# Patient Record
Sex: Female | Born: 1975 | Race: Black or African American | Hispanic: No | Marital: Married | Smoking: Former smoker
Health system: Southern US, Community
[De-identification: ages and names within clinical notes are randomized; demographics above are authoritative.]

## PROBLEM LIST (undated history)

## (undated) DIAGNOSIS — N926 Irregular menstruation, unspecified: Secondary | ICD-10-CM

## (undated) DIAGNOSIS — R0602 Shortness of breath: Secondary | ICD-10-CM

## (undated) DIAGNOSIS — Z8619 Personal history of other infectious and parasitic diseases: Secondary | ICD-10-CM

## (undated) DIAGNOSIS — Z8742 Personal history of other diseases of the female genital tract: Secondary | ICD-10-CM

## (undated) DIAGNOSIS — J069 Acute upper respiratory infection, unspecified: Secondary | ICD-10-CM

## (undated) DIAGNOSIS — B977 Papillomavirus as the cause of diseases classified elsewhere: Secondary | ICD-10-CM

## (undated) DIAGNOSIS — IMO0002 Reserved for concepts with insufficient information to code with codable children: Secondary | ICD-10-CM

## (undated) DIAGNOSIS — I739 Peripheral vascular disease, unspecified: Secondary | ICD-10-CM

## (undated) DIAGNOSIS — D219 Benign neoplasm of connective and other soft tissue, unspecified: Secondary | ICD-10-CM

## (undated) DIAGNOSIS — Z98891 History of uterine scar from previous surgery: Secondary | ICD-10-CM

## (undated) DIAGNOSIS — R51 Headache: Secondary | ICD-10-CM

## (undated) DIAGNOSIS — Z8719 Personal history of other diseases of the digestive system: Secondary | ICD-10-CM

## (undated) DIAGNOSIS — Z8739 Personal history of other diseases of the musculoskeletal system and connective tissue: Secondary | ICD-10-CM

## (undated) DIAGNOSIS — B999 Unspecified infectious disease: Secondary | ICD-10-CM

## (undated) DIAGNOSIS — E010 Iodine-deficiency related diffuse (endemic) goiter: Secondary | ICD-10-CM

## (undated) DIAGNOSIS — Z8639 Personal history of other endocrine, nutritional and metabolic disease: Secondary | ICD-10-CM

## (undated) DIAGNOSIS — R638 Other symptoms and signs concerning food and fluid intake: Secondary | ICD-10-CM

## (undated) DIAGNOSIS — D649 Anemia, unspecified: Secondary | ICD-10-CM

## (undated) HISTORY — PX: WISDOM TOOTH EXTRACTION: SHX21

## (undated) HISTORY — DX: Iodine-deficiency related diffuse (endemic) goiter: E01.0

## (undated) HISTORY — DX: Personal history of other diseases of the musculoskeletal system and connective tissue: Z87.39

## (undated) HISTORY — DX: Personal history of other endocrine, nutritional and metabolic disease: Z86.39

## (undated) HISTORY — DX: Reserved for concepts with insufficient information to code with codable children: IMO0002

## (undated) HISTORY — DX: Personal history of other diseases of the female genital tract: Z87.42

## (undated) HISTORY — DX: Personal history of other infectious and parasitic diseases: Z86.19

## (undated) HISTORY — DX: Unspecified infectious disease: B99.9

## (undated) HISTORY — DX: Other symptoms and signs concerning food and fluid intake: R63.8

## (undated) HISTORY — DX: Acute upper respiratory infection, unspecified: J06.9

## (undated) HISTORY — DX: Personal history of other diseases of the digestive system: Z87.19

## (undated) HISTORY — DX: Headache: R51

## (undated) HISTORY — DX: Papillomavirus as the cause of diseases classified elsewhere: B97.7

## (undated) HISTORY — DX: Peripheral vascular disease, unspecified: I73.9

## (undated) HISTORY — DX: Benign neoplasm of connective and other soft tissue, unspecified: D21.9

## (undated) HISTORY — DX: Irregular menstruation, unspecified: N92.6

## (undated) HISTORY — DX: Anemia, unspecified: D64.9

---
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
## 1996-05-24 DIAGNOSIS — D219 Benign neoplasm of connective and other soft tissue, unspecified: Secondary | ICD-10-CM

## 1996-05-24 HISTORY — DX: Benign neoplasm of connective and other soft tissue, unspecified: D21.9

## 2001-10-19 ENCOUNTER — Other Ambulatory Visit: Admission: RE | Admit: 2001-10-19 | Discharge: 2001-10-19 | Payer: Self-pay | Admitting: Unknown Physician Specialty

## 2006-09-11 ENCOUNTER — Inpatient Hospital Stay (HOSPITAL_COMMUNITY): Admission: AD | Admit: 2006-09-11 | Discharge: 2006-09-11 | Payer: Self-pay | Admitting: Obstetrics and Gynecology

## 2007-01-17 ENCOUNTER — Inpatient Hospital Stay (HOSPITAL_COMMUNITY): Admission: AD | Admit: 2007-01-17 | Discharge: 2007-01-17 | Payer: Self-pay | Admitting: Obstetrics and Gynecology

## 2007-01-18 ENCOUNTER — Inpatient Hospital Stay (HOSPITAL_COMMUNITY): Admission: AD | Admit: 2007-01-18 | Discharge: 2007-01-18 | Payer: Self-pay | Admitting: Obstetrics and Gynecology

## 2007-01-27 ENCOUNTER — Ambulatory Visit (HOSPITAL_COMMUNITY): Admission: RE | Admit: 2007-01-27 | Discharge: 2007-01-27 | Payer: Self-pay | Admitting: Obstetrics and Gynecology

## 2007-04-19 ENCOUNTER — Encounter (INDEPENDENT_AMBULATORY_CARE_PROVIDER_SITE_OTHER): Payer: Self-pay | Admitting: Obstetrics and Gynecology

## 2007-04-19 ENCOUNTER — Inpatient Hospital Stay (HOSPITAL_COMMUNITY): Admission: RE | Admit: 2007-04-19 | Discharge: 2007-04-22 | Payer: Self-pay | Admitting: Obstetrics and Gynecology

## 2007-05-25 DIAGNOSIS — R638 Other symptoms and signs concerning food and fluid intake: Secondary | ICD-10-CM

## 2007-05-25 DIAGNOSIS — N926 Irregular menstruation, unspecified: Secondary | ICD-10-CM

## 2007-05-25 DIAGNOSIS — Z8719 Personal history of other diseases of the digestive system: Secondary | ICD-10-CM

## 2007-05-25 HISTORY — DX: Irregular menstruation, unspecified: N92.6

## 2007-05-25 HISTORY — DX: Other symptoms and signs concerning food and fluid intake: R63.8

## 2007-05-25 HISTORY — DX: Personal history of other diseases of the digestive system: Z87.19

## 2007-06-10 ENCOUNTER — Encounter: Admission: RE | Admit: 2007-06-10 | Discharge: 2007-06-10 | Payer: Self-pay | Admitting: Family Medicine

## 2007-06-16 ENCOUNTER — Encounter: Admission: RE | Admit: 2007-06-16 | Discharge: 2007-06-16 | Payer: Self-pay | Admitting: Family Medicine

## 2007-06-19 ENCOUNTER — Encounter: Admission: RE | Admit: 2007-06-19 | Discharge: 2007-07-19 | Payer: Self-pay | Admitting: Family Medicine

## 2008-05-24 DIAGNOSIS — Z8639 Personal history of other endocrine, nutritional and metabolic disease: Secondary | ICD-10-CM

## 2008-05-24 DIAGNOSIS — E010 Iodine-deficiency related diffuse (endemic) goiter: Secondary | ICD-10-CM

## 2008-05-24 DIAGNOSIS — Z8739 Personal history of other diseases of the musculoskeletal system and connective tissue: Secondary | ICD-10-CM

## 2008-05-24 DIAGNOSIS — Z8742 Personal history of other diseases of the female genital tract: Secondary | ICD-10-CM

## 2008-05-24 HISTORY — DX: Personal history of other endocrine, nutritional and metabolic disease: Z86.39

## 2008-05-24 HISTORY — DX: Iodine-deficiency related diffuse (endemic) goiter: E01.0

## 2008-05-24 HISTORY — DX: Personal history of other diseases of the female genital tract: Z87.42

## 2008-05-24 HISTORY — DX: Personal history of other diseases of the musculoskeletal system and connective tissue: Z87.39

## 2009-04-11 ENCOUNTER — Encounter: Admission: RE | Admit: 2009-04-11 | Discharge: 2009-04-11 | Payer: Self-pay | Admitting: Obstetrics and Gynecology

## 2009-04-14 ENCOUNTER — Encounter: Admission: RE | Admit: 2009-04-14 | Discharge: 2009-04-14 | Payer: Self-pay | Admitting: Obstetrics and Gynecology

## 2010-10-06 NOTE — H&P (Signed)
NAME:  Kathy Stuart, INSCORE                ACCOUNT NO.:  [id number]   MEDICAL RECORD NO.:  [id number]          PATIENT TYPE:  INP   LOCATION:  NA                            FACILITY:  [facility]   PHYSICIAN:  Naima A. Dillard, M.D. DATE OF BIRTH:  [birth year]   DATE OF ADMISSION:  DATE OF DISCHARGE:                              HISTORY & PHYSICAL   CHIEF COMPLAINT:  Twins 38 weeks, for repeat cesarean section.   HISTORY AND PHYSICAL:  Patient is a 35 year old African-American female  gravida 2, para 1, with a due date of May 05, 2007, with twin  pregnancy and history of cesarean section.  Patient desires a repeat  cesarean section.  Pregnancy is complicated by twins.  The last  ultrasound Twin A was transverse and Twin B was breech.  The patient had  thought about VBAC in the past but because they have had malpresentation  and because of her history of previous cesarean section she has decided  to go along with previous cesarean section.  A VBAC consent was  discussed with the patient.  The patient desires to proceed.   PAST GYN HISTORY:  Significant for:  1. History of abnormal Pap smear.  2. Had a history of colposcopy and repeat Pap smears.   PAST MEDICAL HISTORY:  As above.   PAST SURGICAL HISTORY:  Significant for:  1. Cesarean section x1.  2. Removal of wisdom teeth.   PAST OB HISTORY:  Significant for a C-section in March of 1999 secondary  to failure to progress.   FAMILY HISTORY:  Significant for chronic hypertension in her father and  COPD with her father.   REVIEW OF SYSTEMS:  ENDOCRINE:  Unremarkable.  MUSCULOSKELETAL:  No  weakness.  GENITOURINARY:  Pregnancy with twins.  HEMATOLOGICAL:  Within  normal limits.   PHYSICAL EXAM:  The patient weighs 327 pounds, blood pressure is 150/64,  fetal heart tones 155 and 126.  The patient is alert and oriented.  She  does have tooth pain and pressure from the pregnancy.  HEART:  Regular rate and rhythm.  LUNGS:  Clear to  auscultation bilaterally.  EXTREMITIES:  No cyanosis, clubbing.  She does have trace edema.  THYROID:  Not enlarged.  BREAST EXAM:  Within normal limits without any abnormal masses.  ABDOMEN:  Gravid, soft and nontender.  VULVOVAGINAL EXAM:  Within normal limits.  CERVICAL EXAM:  Long and closed.   PERTINENT LABORATORY DATA:  Her group B strep was negative.  HIV is  nonreactive.  She is rubella immune.  She is Rh positive.  RPR was  nonreactive.  One-hour Glucola test was within normal limits.  Hepatitis  B Surface Antigen was negative.  Pap smear was within normal limits.   ASSESSMENT:  Twin pregnancy for repeat cesarean section.  The patient  understands the risks to be, but not limited to, bleeding, infection,  damage to internal organs such as bowel, bladder and major blood  vessels.  Would not recommend vaginal birth after cesarean and there is  a known fact that cesarean section between 37 and 38 weeks  with twins  decreases morbidity.      Naima A. Normand Sloop, M.D.  Electronically Signed     NAD/MEDQ  D:  04/18/2007  T:  04/18/2007  Job:  [id number]

## 2010-10-06 NOTE — Op Note (Signed)
NAME:  Kathy Stuart, Kathy Stuart                ACCOUNT NO.:  [id number]   MEDICAL RECORD NO.:  [id number]          PATIENT TYPE:  INP   LOCATION:  9102                          FACILITY:  [facility]   PHYSICIAN:  Kathy A. Dillard, M.D. DATE OF BIRTH:  11-18-1975   DATE OF PROCEDURE:  04/19/2007  DATE OF DISCHARGE:                               OPERATIVE REPORT   PREOPERATIVE DIAGNOSES:  1. Twins at 38 weeks, twin A is transverse, twin B is breech.  2. The patient desires repeat cesarean section.   POSTOPERATIVE DIAGNOSES:  1. Twins at 38 weeks, twin A is transverse, twin B is breech.  2. The patient desires repeat cesarean section.   PROCEDURE:  Repeat cesarean section.   SURGEON:  Kathy A. Dillard, M.D.   ASSISTANT:  [address], CNM, and Elby Showers. Mayford Knife, CNM.   ANESTHESIA:  Spinal.   FINDINGS:  A female infant in transverse presentation with clear fluid  born at 11:31 with Apgars of 3, 7 and 9.  Her cord pH arterial was 7.25,  venous pH of 7.33.  She weighed 6 pounds 15 ounces.  Baby B was breech  with clear fluid, born at 11:33 with Apgars of 9 and 9.  Both arterial  and venous pH's the same at 7.33 and a weight of 7 pounds even.  Both  placentas were sent to pathology.   ESTIMATED BLOOD LOSS:  900 mL.   INTRAVENOUS FLUIDS:  2900 mL crystalloid.   URINE OUTPUT:  Was 250 mL clear urine at the end of the procedure.   The patient went to PACU in stable condition.   PROCEDURE IN DETAIL:  Before the procedure, the patient was consented  for a C-section.  She understands the risks are but not limited to  bleeding, infection, damage to internal organs such as bowel, bladder  and major blood vessels, damage to the infant such as laceration from  the knife, and there is also respiratory distress.  The patient wanted  to proceed with cesarean section.  She was given spinal anesthesia,  placed in dorsal supine position with a left lateral tilt.  Once  anesthesia was found to be  adequate, a Pfannenstiel skin incision was  made along her prior incision with the scalpel and carried down to the  fascia.  The fascia was incised in the midline, extended bilaterally.  Kochers x2 were placed on the superior aspect of the fascia, which was  dissected off both sharply and bluntly, and the inferior aspect of the  fascia was dissected in a similar fashion.  The patient did have some  adhesions from the peritoneum to the abdominal wall.  These were taken  down both sharply and bluntly.  The peritoneum was identified, tented up  and entered sharply and extended superiorly and inferiorly with good  visualization of bowel and bladder.  A bladder blade was inserted,  vesicouterine peritoneum was identified, tented up and entered sharply  and extended bilaterally.  The bladder blade was reinserted.  A lower  transverse uterine incision was made with the scalpel and then extended  bilaterally.  It was noted that placenta was noted right when I entered  the uterus that we were right at the placenta.  I went slightly under  the placenta to see the membranes and ruptured them with clear fluid.  Baby A was transverse, back down.  I was able, however to move her head  towards the incision.  Her head was a little difficult to deliver out of  the incision so one vacuum was placed in correct position and one pull  in the green zone, no pop-off.  She had a nuchal cord x1, which was  easily reduced.  Mouth and nares were bulb-suctioned, cord was clamped  and cut, cord gases were obtained and the placenta was marked with a  cord clamped in order to distinguish between placenta A and placenta B.  Baby B, I then grabbed both feet, then ruptured the membranes was clear  fluid and delivered him by breech maneuvers without difficulty.  He also  had a nuchal cord x1, which was easily reduced.  Hemostasis was assured.  The placentas were manually delivered.  The uterus was cleared of all  clot and  debris.  The uterine incision was repaired with 0 Vicryl in a  running locked fashion.  A second layer of 0 Vicryl was used to  imbricate the uterus.  There was some bleeding at the patient's left  angle which was made hemostatic with a figure-of-eight and then after  making it, a hematoma started to form and grow.  This was then tied off  or sutured with 0 Vicryl in order to ligate the hematoma.  It was  successful and did stop growing.  Irrigation was done.  Both ovaries and  tubes were seen and noted to be normal.  The patient had normal  abdominal anatomy.  Irrigation was done again and hemostasis was  assured.  The peritoneum was closed using 0 chromic.  The muscles were  irrigated and noted to be hemostatic.  The fascia was closed using 0  Vicryl in a running fashion.  A Jackson-Pratt drain was placed into the  abdomen.  Subcutaneous tissue was reapproximated using 2-0 plain.  The  skin incision was closed with 3-0 Monocryl in a subcuticular fashion.  Sponge, lap and needle counts were correct.  The patient went to the  recovery room in stable condition.      Kathy Stuart, M.D.  Electronically Signed     NAD/MEDQ  D:  04/19/2007  T:  04/19/2007  Job:  [id number]

## 2010-10-06 NOTE — Discharge Summary (Signed)
NAMEMANDEE, Kathy Stuart                ACCOUNT NO.:  [id number]   MEDICAL RECORD NO.:  [id number]          PATIENT TYPE:  INP   LOCATION:  9102                          [facility]  [facility]   PHYSICIAN:  Crist Fat. Rivard, M.D. DATE OF BIRTH:  12-26-1975   DATE OF ADMISSION:  04/19/2007  DATE OF DISCHARGE:  04/22/2007                               DISCHARGE SUMMARY   ADMITTING DIAGNOSES:  1. Twin pregnancy at term.  2. A previous cesarean section.  3. Desires repeat cesarean section.   DISCHARGE DIAGNOSES:  1. Twin pregnancy at term.  2. A previous cesarean section.  3. Desires repeat cesarean section.   PROCEDURE:  Repeat cesarean section.   SURGEON:  Dr. Jaymes Graff with assistance Roseanne Reno, certified  nurse midwife, and Wynelle Bourgeois, certified nurse midwife.   HOSPITAL COURSE:  The patient is a 35 year old black female, gravida 2  para 1-0-0-1 at 28 weeks' gestation with [facility] May 05, 2007 who  presents with a twin pregnancy for repeat cesarean section.  Most recent  ultrasound showed twin A in the transverse position and twin B breech  position.  Her pregnancy has been followed by the [facility]  OB/GYN MD service and has been remarkable for:  1. Previous C-section.  2. Twin pregnancy.  3. Toxoplasmosis risk.  4. First trimester spotting.  5. Obesity.   The patient was prepped for the OR.  Procedure was a repeat cesarean  section and was without complication.  Infant A was a viable female,  Apgars of 3, 7, and 9 at 1, 5, and 10 minutes respectively.  Weight was  6 pounds 15 ounces.  She was initially in a transverse presentation with  her back down.  Delivery was via the vacuum with one pulls and no pop-  off.  Infant B was a viable female with Apgars of 9 and 9 at 1 and 5  minutes respectively.  Weight was 7 pounds and 0 ounces.  He was  delivered from a breech presentation.  Infants were doing well and were  taken to the full-term nursery.  The patient  tolerated the remainder of  the cesarean section well and was taken to the recovery room.  By postop  day #1, her hemoglobin was 9.3 and had been 11.8 preoperatively.  She  was having some gas pains, was tolerating food well, was breast-feeding  the infants.  By postop day #2, she was continuing to do well, except  for some mid back spasms for which she was given Flexeril with good  relief.  She was breast and bottle feeding.  Her JP drain was having  minimal to moderate drainage.  By postop day #3, she continued to do  well and was deemed to have received full benefit of her hospital stay.  JP drain was removed by Philipp Deputy, certified nurse midwife, without  incident.  The patient was discharged home.  Discharge instructions per  [facility] handout.   DISCHARGE MEDICATIONS:  Motrin 600 mg 1 p.o. q.6 hours p.r.n. pain,  Tylox 1 to 2 p.o. q.3 to 4  hours p.r.n. pain, Flexeril 10 mg 1 p.o.  q.i.d. p.r.n. back spasms, and prenatal vitamin 1 p.o. daily, Micronor 1  p.o. daily to May 07, 2007.   DISCHARGE FOLLOWUP:  Will occur as scheduled at [facility]  or as needed.      Cam Hai, C.N.M.      Crist Fat Rivard, M.D.  Electronically Signed    KS/MEDQ  D:  04/22/2007  T:  04/22/2007  Job:  [id number]

## 2010-10-06 NOTE — H&P (Signed)
NAME:  Kathy Stuart, SPATES                ACCOUNT NO.:  [id number]   MEDICAL RECORD NO.:  [id number]          PATIENT TYPE:  INP   LOCATION:                                [facility]   PHYSICIAN:  Naima A. Dillard, M.D.      DATE OF BIRTH:   DATE OF ADMISSION:  04/19/2007  DATE OF DISCHARGE:                              HISTORY & PHYSICAL   HISTORY:  Ms. Austria is a 35 year old married black female, gravida 2,  para 1, 0, 0, 1, at 37-5/7th weeks, who presents on the date of  admission for a scheduled repeat low transverse cesarean section.  The  patient's history is remarkable for  1. Twin pregnancy.  2. Previous C-section.  3. Toxo risk.  4. Elevated BMI.  5. First trimester spotting.   PRENATAL LABORATORY DATA:  The patient's blood type is O-positive, Rh  antibody screen negative.  Rubella immune.  Hepatitis-B surface antigen  negative.  HIV was nonreactive.  Hemoglobin electrophoresis was normal.  Her hemoglobin at her new OB visit was 11.5 and a hematocrit was 35.4.  Her platelets at that time were 256.  She had an early one-hour GTT that  was within normal limits, as well as a routine one-hour GTT that was  also within normal limits at the beginning of her third trimester and  her Group-beta strep is negative.   HISTORY OF PRESENT PREGNANCY:  The patient entered care at [facility] in mid-April at approximately six weeks.  She was fairly  certain of the last menstrual period of July 29, 2006, giving her an [facility]  of 05/05/2007.  She called following that new OB R.N. interview, with  the complaint of first trimester spotting and had a follow-up ultrasound  on September 08, 2006, noting twin gestation with Twin-A being low-lying  close to the cervix.  The patient had her new OB nurse midwife interview  and workup at approximately 9-5/7th weeks, where she voiced desire for a  repeat cesarean section.  She declined a first trimester screen.  The  plan was made to have early  18-week Glucola, secondary to elevated BMI.  Most recent Pap was in September 2007, and was within normal limits.  The patient had an anatomy scan at approximately 17 weeks and six days.  The twin gestation was noted to be Dy, Dy twins with baby A being female  and baby B being female.  Both babies had normal growth and development.  Some difficulty viewing all anatomy and therefore anatomy scan was  repeated at 20-3/7th weeks.  The patient's cervix at that time was equal  to 4.26 cm and all anatomy was normal.  On December 23, 2006, the patient  did have her first one-hour GTT and it was within normal limits.  Around  24 weeks the patient did present with pre-term contractions and did have  a subsequent positive fetal fibronectin, for which she was placed on  bedrest and received betamethasone injections.  The patient at that time  had also complained of some right lower extremity pain with  a positive  Homan's sign in the office and was sent to [facility] and  had a Doppler evaluation, to rule out a deep venous thrombosis.  The  Doppler was negative.  At approximately 26 weeks, the patient had a  follow-up growth ultrasound.  The twins were noted to have concordant  growth.  The cervical length was approximately 4.2 cm and her cervical  exam was closed and long.   At approximately 27 weeks, the patient did have a follow-up one-hour GTT  which was within normal limits.  On February 03, 2007, also around 27  weeks, she did have a repeat fetal fibronectin which was negative.  At  28-4/7th weeks  the patient had a follow-up ultrasound for growth.  Did  note discordant growth in Twin B with polyhydramnios.  The cervical  length was noted to be 3.8 cm.  At 30-4/7th weeks the patient had a  repeat ultrasound showing twin A in breech presentation and Twin B with  oblique lie.  Cervical length was 3.10 cm.  At approximately 34-5/7th  weeks the patient had a repeat ultrasound and both  Twin A and Twin B  with normal amniotic fluid index.  Twin A had a transverse lie and twin  B had an oblique lie.  At that time the patient's C-section was  rescheduled for April 19, 2007, per Dr. Samule Ohm A. Dillard's order.  It  had originally been scheduled for April 25, 2007.   The patient's last visit in the office, an ultrasound was at 37 weeks  even.  Baby A estimated fetal weight was 6 pounds 12 ounces and had  normal fluid and remained in a transverse lie with fetal head to  maternal left.  Baby B female continued to have polyhydramnios.  The  estimated fetal weight was 6 pounds 12 ounces and with breech  presentation.  No discordance of growth was noted.  GBS was negative.   OBSTETRICAL HISTORY:  G 1:  The patient had a C-section where she was  induced for water retention.  She had a viable female weighing 8 pounds  9 ounces at approximately [redacted] weeks gestation and the delivery was in  March 1999.  G 2 is the present pregnancy, twin gestation.   PAST MEDICAL HISTORY:  1. The patient reports oral contraceptive pill use in the past for      birth control.  She reports she has used Yasmin and Ortho-Cyclen.  2. She also reports a history of abnormal Pap smears.  She had      colposcopy in 1995, with a biopsy and repeat Paps within normal      limits.  3. She reports a history of fibroids before her pregnancy in 1995.      The patient does have toxo risk, secondary to two cats at home.  4. Reports a history of chickenpox as a child.  5. The patient also reports anemia as a child and borderline at onset      of pregnancy.   ALLERGIES:  The patient denies any medication or latex allergies.  She  does report seasonable allergies to pine trees and to corn.   FAMILY HISTORY:  The patient reports her paternal grandfather deceased,  secondary to a heart attack.  She reports her dad as having chronic  hypertension and on medications.  Also reports her dad with chronic  obstructive  pulmonary disease and on albuterol inhaler.  She reports  maternal grandmother has hypothyroidism and  on medications.  Reports  that her paternal grandmother died, secondary to a stroke and TIAs.  The  patient has a half-sister who is bipolar.  The patient reports both  parents are smokers.   PAST SURGICAL HISTORY:  The patient had a C-section in 1999.   GENETIC HISTORY:  Unremarkable.   SOCIAL HISTORY:  The patient is a married black female.  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  Her husband is Mr. Jameca Chumley.  He is a full-time Location manager  and reports 12 years of education.  The patient denied alcohol, tobacco  or illegal drug use during the pregnancy.  She did report rare ingestion  of wine before pregnancy.  The patient has been followed by the  physicians' service at [facility].   PHYSICAL EXAMINATION:  VITAL SIGNS:  Stable.  The patient is afebrile.  HEENT:  Within normal limits.  LUNGS:  Breath sounds clear to auscultation bilaterally.  HEART:  A regular rate and rhythm without murmur.  BREASTS:  Soft, nontender.  ABDOMEN:  Fundal height is approximately 42 cm.  The patient with  irregular uterine contractions.  Fetal heart rate twin A in the office  was 155 and twin B was 126.  PELVIC EXAM:  The patient's last cervical exam was done at approximately  35-4/7th weeks and the cervix was closed and long.  The pelvic exam is  deferred today.  EXTREMITIES:  Deep tendon reflexes within normal limits without clonus.  Trace to mild edema noted.   IMPRESSION:  1. Intrauterine pregnancy at 37/5/7th weeks.  2. Dy, Dy twin gestation.  3. Twin A with transverse lie and twin B with breech presentation.  4. The patient desires repeat low transverse cesarean section.  5. Negative Group-B Streptococcus.   PLAN:  1. Admit to [facility] for a consultation with Dr.      Normand Sloop as attending physician.   2. Routine physician preoperative orders.  3. The risks and benefits of a repeat cesarean section were reviewed      with the patient per Dr. Normand Sloop and the patient does wish to      proceed with plans.     ______________________________  Larna Daughters, CNM      Naima A. Normand Sloop, M.D.  Electronically Signed    CHS/MEDQ  D:  04/18/2007  T:  04/18/2007  Job:  [id number]

## 2011-03-02 LAB — CBC
HCT: 28.3 — ABNORMAL LOW
HCT: 35.5 — ABNORMAL LOW
Hemoglobin: 11.8 — ABNORMAL LOW
Hemoglobin: 9.3 — ABNORMAL LOW
MCHC: 32.9
MCV: 81.2
RBC: 3.46 — ABNORMAL LOW
RBC: 4.37
RDW: 18.1 — ABNORMAL HIGH
WBC: 7.5

## 2011-12-28 ENCOUNTER — Encounter: Payer: Self-pay | Admitting: Obstetrics and Gynecology

## 2011-12-28 ENCOUNTER — Ambulatory Visit (INDEPENDENT_AMBULATORY_CARE_PROVIDER_SITE_OTHER): Payer: BC Managed Care – PPO | Admitting: Obstetrics and Gynecology

## 2011-12-28 VITALS — BP 120/70 | Ht 68.0 in | Wt 209.0 lb

## 2011-12-28 DIAGNOSIS — Z36 Encounter for antenatal screening of mother: Secondary | ICD-10-CM

## 2011-12-28 DIAGNOSIS — N920 Excessive and frequent menstruation with regular cycle: Secondary | ICD-10-CM

## 2011-12-28 DIAGNOSIS — Z8742 Personal history of other diseases of the female genital tract: Secondary | ICD-10-CM | POA: Insufficient documentation

## 2011-12-28 NOTE — Progress Notes (Signed)
H/o menorrhagia and desires ablation.  Does not want any more children (has 6 b/n her and her husband)  Filed Vitals:   12/28/11 1136  BP: 120/70   A/P U/s next available in prep for ablation (in office vs hosp) +/- BTL (pt to discuss vasectomy and BC options with husband) F/u u/s and pelvic at [facility] +/- em bx F/u after u/s

## 2012-01-18 ENCOUNTER — Encounter: Payer: BC Managed Care – PPO | Admitting: Obstetrics and Gynecology

## 2012-01-18 ENCOUNTER — Other Ambulatory Visit: Payer: BC Managed Care – PPO

## 2012-02-09 ENCOUNTER — Ambulatory Visit (INDEPENDENT_AMBULATORY_CARE_PROVIDER_SITE_OTHER): Payer: BC Managed Care – PPO | Admitting: Obstetrics and Gynecology

## 2012-02-09 DIAGNOSIS — O26849 Uterine size-date discrepancy, unspecified trimester: Secondary | ICD-10-CM

## 2012-02-09 DIAGNOSIS — Z331 Pregnant state, incidental: Secondary | ICD-10-CM

## 2012-02-09 LAB — POCT URINALYSIS DIPSTICK
Ketones, UA: NEGATIVE
Spec Grav, UA: <=1.005
Urobilinogen, UA: NEGATIVE
pH, UA: 8

## 2012-02-09 NOTE — Progress Notes (Signed)
NOB interview. Pt states had spotting x 2 weeks 01/05/12. None since. Per VL scheduled U/S prior to NOB W/U. Was taking VIT D q week. Per VL advised to D/C. Vit D level done today. Pt's insurance preferred lab is [facility] but prefers to have labs done on site.

## 2012-02-10 ENCOUNTER — Encounter: Payer: Self-pay | Admitting: Obstetrics and Gynecology

## 2012-02-10 ENCOUNTER — Telehealth: Payer: Self-pay | Admitting: Obstetrics and Gynecology

## 2012-02-10 DIAGNOSIS — E559 Vitamin D deficiency, unspecified: Secondary | ICD-10-CM | POA: Insufficient documentation

## 2012-02-10 LAB — PRENATAL PANEL VII
Antibody Screen: NEGATIVE
Basophils Relative: 0 % (ref 0–1)
Eosinophils Absolute: 0.1 10*3/uL (ref 0.0–0.7)
Eosinophils Relative: 1 % (ref 0–5)
Hepatitis B Surface Ag: NEGATIVE
MCH: 28.3 pg (ref 26.0–34.0)
MCHC: 34.3 g/dL (ref 30.0–36.0)
MCV: 82.6 fL (ref 78.0–100.0)
Monocytes Relative: 7 % (ref 3–12)
Neutrophils Relative %: 65 % (ref 43–77)
Platelets: 273 10*3/uL (ref 150–400)
Rubella: 110.2 IU/mL — ABNORMAL HIGH

## 2012-02-10 LAB — VITAMIN D 25 HYDROXY (VIT D DEFICIENCY, FRACTURES): Vit D, 25-Hydroxy: 28 ng/mL — ABNORMAL LOW (ref 30–89)

## 2012-02-10 NOTE — Telephone Encounter (Signed)
Tc to pt.  Confirmed Vit D dosage.

## 2012-02-10 NOTE — Telephone Encounter (Signed)
Triage[facility] about rx

## 2012-02-10 NOTE — Telephone Encounter (Signed)
TC to pt. LM to return call.  

## 2012-02-12 ENCOUNTER — Encounter: Payer: Self-pay | Admitting: Obstetrics and Gynecology

## 2012-02-12 DIAGNOSIS — O2340 Unspecified infection of urinary tract in pregnancy, unspecified trimester: Secondary | ICD-10-CM | POA: Insufficient documentation

## 2012-02-12 LAB — CULTURE, OB URINE: Colony Count: >=100000

## 2012-02-14 ENCOUNTER — Telehealth: Payer: Self-pay | Admitting: Obstetrics and Gynecology

## 2012-02-14 ENCOUNTER — Encounter: Payer: Self-pay | Admitting: Obstetrics and Gynecology

## 2012-02-14 DIAGNOSIS — Z8759 Personal history of other complications of pregnancy, childbirth and the puerperium: Secondary | ICD-10-CM | POA: Insufficient documentation

## 2012-02-14 DIAGNOSIS — Z98891 History of uterine scar from previous surgery: Secondary | ICD-10-CM | POA: Insufficient documentation

## 2012-02-14 DIAGNOSIS — O09529 Supervision of elderly multigravida, unspecified trimester: Secondary | ICD-10-CM | POA: Insufficient documentation

## 2012-02-14 MED ORDER — CEPHALEXIN 500 MG PO CAPS: 500.0000 mg | ORAL_CAPSULE | Freq: Two times a day (BID) | ORAL | Status: DC

## 2012-02-14 NOTE — Telephone Encounter (Signed)
TC to pt. Per VL informed of + urine culture and need for RX. Advised to drink 8-10 glasses water. Keep appt 02/15/12. Needs TOC after completion. Pt verbalizes comprehension.

## 2012-02-14 NOTE — Telephone Encounter (Signed)
Message copied by Mason Jim on Mon Feb 14, 2012  4:54 PM ------      Message from: Cornelius Moras      Created: Sat Feb 12, 2012  5:39 PM       + UTI.      Rx Keflex 500 mg po BID x 7 days.      Check TOC after treatment.

## 2012-02-15 ENCOUNTER — Ambulatory Visit (INDEPENDENT_AMBULATORY_CARE_PROVIDER_SITE_OTHER): Payer: BC Managed Care – PPO | Admitting: Obstetrics and Gynecology

## 2012-02-15 ENCOUNTER — Encounter: Payer: Self-pay | Admitting: Obstetrics and Gynecology

## 2012-02-15 ENCOUNTER — Ambulatory Visit (INDEPENDENT_AMBULATORY_CARE_PROVIDER_SITE_OTHER): Payer: BC Managed Care – PPO

## 2012-02-15 ENCOUNTER — Other Ambulatory Visit: Payer: Self-pay | Admitting: Obstetrics and Gynecology

## 2012-02-15 VITALS — BP 120/70 | Wt 297.0 lb

## 2012-02-15 DIAGNOSIS — Z331 Pregnant state, incidental: Secondary | ICD-10-CM

## 2012-02-15 DIAGNOSIS — O26849 Uterine size-date discrepancy, unspecified trimester: Secondary | ICD-10-CM

## 2012-02-15 LAB — POCT WET PREP (WET MOUNT)
Clue Cells Wet Prep Whiff POC: NEGATIVE
pH: 5

## 2012-02-15 LAB — US OB TRANSVAGINAL

## 2012-02-15 NOTE — Progress Notes (Signed)
[redacted]w[redacted]d Dating u/s today Single IUP Amnion and yolk sac present Anteverted uterus Measurements = LMP Gest Sac lower 3rd of uterus CL on RTOV Last pap 03/2011 WNL per pt  Pt will discuss genetic screenings with CNM.  Pt voided before u/s didn't leave sample.

## 2012-02-15 NOTE — Patient Instructions (Addendum)
ABCs of Pregnancy A Antepartum care is very important. Be sure you see your doctor and get prenatal care as soon as you think you are pregnant. At this time, you will be tested for infection, genetic abnormalities and potential problems with you and the pregnancy. This is the time to discuss diet, exercise, work, medications, labor, pain medication during labor and the possibility of a cesarean delivery. Ask any questions that may concern you. It is important to see your doctor regularly throughout your pregnancy. Avoid exposure to toxic substances and chemicals - such as cleaning solvents, lead and mercury, some insecticides, and paint. Pregnant women should avoid exposure to paint fumes, and fumes that cause you to feel ill, dizzy or faint. When possible, it is a good idea to have a pre-pregnancy consultation with your caregiver to begin some important recommendations your caregiver suggests such as, taking folic acid, exercising, quitting smoking, avoiding alcoholic beverages, etc. B Breastfeeding is the healthiest choice for both you and your baby. It has many nutritional benefits for the baby and health benefits for the mother. It also creates a very tight and loving bond between the baby and mother. Talk to your doctor, your family and friends, and your employer about how you choose to feed your baby and how they can support you in your decision. Not all birth defects can be prevented, but a woman can take actions that may increase her chance of having a healthy baby. Many birth defects happen very early in pregnancy, sometimes before a woman even knows she is pregnant. Birth defects or abnormalities of any child in your or the father's family should be discussed with your caregiver. Get a good support bra as your breast size changes. Wear it especially when you exercise and when nursing.  C Celebrate the news of your pregnancy with the your spouse/father and family. Childbirth classes are helpful to  take for you and the spouse/father because it helps to understand what happens during the pregnancy, labor and delivery. Cesarean delivery should be discussed with your doctor so you are prepared for that possibility. The pros and cons of circumcision if it is a boy, should be discussed with your pediatrician. Cigarette smoking during pregnancy can result in low birth weight babies. It has been associated with infertility, miscarriages, tubal pregnancies, infant death (mortality) and poor health (morbidity) in childhood. Additionally, cigarette smoking may cause long-term learning disabilities. If you smoke, you should try to quit before getting pregnant and not smoke during the pregnancy. Secondary smoke may also harm a mother and her developing baby. It is a good idea to ask people to stop smoking around you during your pregnancy and after the baby is born. Extra calcium is necessary when you are pregnant and is found in your prenatal vitamin, in dairy products, green leafy vegetables and in calcium supplements. D A healthy diet according to your current weight and height, along with vitamins and mineral supplements should be discussed with your caregiver. Domestic abuse or violence should be made known to your doctor right away to get the situation corrected. Drink more water when you exercise to keep hydrated. Discomfort of your back and legs usually develops and progresses from the middle of the second trimester through to delivery of the baby. This is because of the enlarging baby and uterus, which may also affect your balance. Do not take illegal drugs. Illegal drugs can seriously harm the baby and you. Drink extra fluids (water is best) throughout pregnancy to help   your body keep up with the increases in your blood volume. Drink at least 6 to 8 glasses of water, fruit juice, or milk each day. A good way to know you are drinking enough fluid is when your urine looks almost like clear water or is very light  yellow.  E Eat healthy to get the nutrients you and your unborn baby need. Your meals should include the five basic food groups. Exercise (30 minutes of light to moderate exercise a day) is important and encouraged during pregnancy, if there are no medical problems or problems with the pregnancy. Exercise that causes discomfort or dizziness should be stopped and reported to your caregiver. Emotions during pregnancy can change from being ecstatic to depression and should be understood by you, your partner and your family. F Fetal screening with ultrasound, amniocentesis and monitoring during pregnancy and labor is common and sometimes necessary. Take 400 micrograms of folic acid daily both before, when possible, and during the first few months of pregnancy to reduce the risk of birth defects of the brain and spine. All women who could possibly become pregnant should take a vitamin with folic acid, every day. It is also important to eat a healthy diet with fortified foods (enriched grain products, including cereals, rice, breads, and pastas) and foods with natural sources of folate (orange juice, green leafy vegetables, beans, peanuts, broccoli, asparagus, peas, and lentils). The father should be involved with all aspects of the pregnancy including, the prenatal care, childbirth classes, labor, delivery, and postpartum time. Fathers may also have emotional concerns about being a father, financial needs, and raising a family. G Genetic testing should be done appropriately. It is important to know your family and the father's history. If there have been problems with pregnancies or birth defects in your family, report these to your doctor. Also, genetic counselors can talk with you about the information you might need in making decisions about having a family. You can call a major medical center in your area for help in finding a board-certified genetic counselor. Genetic testing and counseling should be done  before pregnancy when possible, especially if there is a history of problems in the mother's or father's family. Certain ethnic backgrounds are more at risk for genetic defects. H Get familiar with the hospital where you will be having your baby. Get to know how long it takes to get there, the labor and delivery area, and the hospital procedures. Be sure your medical insurance is accepted there. Get your home ready for the baby including, clothes, the baby's room (when possible), furniture and car seat. Hand washing is important throughout the day, especially after handling raw meat and poultry, changing the baby's diaper or using the bathroom. This can help prevent the spread of many bacteria and viruses that cause infection. Your hair may become dry and thinner, but will return to normal a few weeks after the baby is born. Heartburn is a common problem that can be treated by taking antacids recommended by your caregiver, eating smaller meals 5 or 6 times a day, not drinking liquids when eating, drinking between meals and raising the head of your bed 2 to 3 inches. I Insurance to cover you, the baby, doctor and hospital should be reviewed so that you will be prepared to pay any costs not covered by your insurance plan. If you do not have medical insurance, there are usually clinics and services available for you in your community. Take 30 milligrams of iron during   your pregnancy as prescribed by your doctor to reduce the risk of low red blood cells (anemia) later in pregnancy. All women of childbearing age should eat a diet rich in iron. J There should be a joint effort for the mother, father and any other children to adapt to the pregnancy financially, emotionally, and psychologically during the pregnancy. Join a support group for moms-to-be. Or, join a class on parenting or childbirth. Have the family participate when possible. K Know your limits. Let your caregiver know if you experience any of the  following:   Pain of any kind.   Strong cramps.   You develop a lot of weight in a short period of time (5 pounds in 3 to 5 days).   Vaginal bleeding, leaking of amniotic fluid.   Headache, vision problems.   Dizziness, fainting, shortness of breath.   Chest pain.   Fever of 102 F (38.9 C) or higher.   Gush of clear fluid from your vagina.   Painful urination.   Domestic violence.   Irregular heartbeat (palpitations).   Rapid beating of the heart (tachycardia).   Constant feeling sick to your stomach (nauseous) and vomiting.   Trouble walking, fluid retention (edema).   Muscle weakness.   If your baby has decreased activity.   Persistent diarrhea.   Abnormal vaginal discharge.   Uterine contractions at 20-minute intervals.   Back pain that travels down your leg.  L Learn and practice that what you eat and drink should be in moderation and healthy for you and your baby. Legal drugs such as alcohol and caffeine are important issues for pregnant women. There is no safe amount of alcohol a woman can drink while pregnant. Fetal alcohol syndrome, a disorder characterized by growth retardation, facial abnormalities, and central nervous system dysfunction, is caused by a woman's use of alcohol during pregnancy. Caffeine, found in tea, coffee, soft drinks and chocolate, should also be limited. Be sure to read labels when trying to cut down on caffeine during pregnancy. More than 200 foods, beverages, and over-the-counter medications contain caffeine and have a high salt content! There are coffees and teas that do not contain caffeine. M Medical conditions such as diabetes, epilepsy, and high blood pressure should be treated and kept under control before pregnancy when possible, but especially during pregnancy. Ask your caregiver about any medications that may need to be changed or adjusted during pregnancy. If you are currently taking any medications, ask your caregiver if it  is safe to take them while you are pregnant or before getting pregnant when possible. Also, be sure to discuss any herbs or vitamins you are taking. They are medicines, too! Discuss with your doctor all medications, prescribed and over-the-counter, that you are taking. During your prenatal visit, discuss the medications your doctor may give you during labor and delivery. N Never be afraid to ask your doctor or caregiver questions about your health, the progress of the pregnancy, family problems, stressful situations, and recommendation for a pediatrician, if you do not have one. It is better to take all precautions and discuss any questions or concerns you may have during your office visits. It is a good idea to write down your questions before you visit the doctor. O Over-the-counter cough and cold remedies may contain alcohol or other ingredients that should be avoided during pregnancy. Ask your caregiver about prescription, herbs or over-the-counter medications that you are taking or may consider taking while pregnant.  P Physical activity during pregnancy can   benefit both you and your baby by lessening discomfort and fatigue, providing a sense of well-being, and increasing the likelihood of early recovery after delivery. Light to moderate exercise during pregnancy strengthens the belly (abdominal) and back muscles. This helps improve posture. Practicing yoga, walking, swimming, and cycling on a stationary bicycle are usually safe exercises for pregnant women. Avoid scuba diving, exercise at high altitudes (over 3000 feet), skiing, horseback riding, contact sports, etc. Always check with your doctor before beginning any kind of exercise, especially during pregnancy and especially if you did not exercise before getting pregnant. Q Queasiness, stomach upset and morning sickness are common during pregnancy. Eating a couple of crackers or dry toast before getting out of bed. Foods that you normally love may  make you feel sick to your stomach. You may need to substitute other nutritious foods. Eating 5 or 6 small meals a day instead of 3 large ones may make you feel better. Do not drink with your meals, drink between meals. Questions that you have should be written down and asked during your prenatal visits. R Read about and make plans to baby-proof your home. There are important tips for making your home a safer environment for your baby. Review the tips and make your home safer for you and your baby. Read food labels regarding calories, salt and fat content in the food. S Saunas, hot tubs, and steam rooms should be avoided while you are pregnant. Excessive high heat may be harmful during your pregnancy. Your caregiver will screen and examine you for sexually transmitted diseases and genetic disorders during your prenatal visits. Learn the signs of labor. Sexual relations while pregnant is safe unless there is a medical or pregnancy problem and your caregiver advises against it. T Traveling long distances should be avoided especially in the third trimester of your pregnancy. If you do have to travel out of state, be sure to take a copy of your medical records and medical insurance plan with you. You should not travel long distances without seeing your doctor first. Most airlines will not allow you to travel after 36 weeks of pregnancy. Toxoplasmosis is an infection caused by a parasite that can seriously harm an unborn baby. Avoid eating undercooked meat and handling cat litter. Be sure to wear gloves when gardening. Tingling of the hands and fingers is not unusual and is due to fluid retention. This will go away after the baby is born. U Womb (uterus) size increases during the first trimester. Your kidneys will begin to function more efficiently. This may cause you to feel the need to urinate more often. You may also leak urine when sneezing, coughing or laughing. This is due to the growing uterus pressing  against your bladder, which lies directly in front of and slightly under the uterus during the first few months of pregnancy. If you experience burning along with frequency of urination or bloody urine, be sure to tell your doctor. The size of your uterus in the third trimester may cause a problem with your balance. It is advisable to maintain good posture and avoid wearing high heels during this time. An ultrasound of your baby may be necessary during your pregnancy and is safe for you and your baby. V Vaccinations are an important concern for pregnant women. Get needed vaccines before pregnancy. Center for Disease Control (www.cdc.gov) has clear guidelines for the use of vaccines during pregnancy. Review the list, be sure to discuss it with your doctor. Prenatal vitamins are helpful   and healthy for you and the baby. Do not take extra vitamins except what is recommended. Taking too much of certain vitamins can cause overdose problems. Continuous vomiting should be reported to your caregiver. Varicose veins may appear especially if there is a family history of varicose veins. They should subside after the delivery of the baby. Support hose helps if there is leg discomfort. W Being overweight or underweight during pregnancy may cause problems. Try to get within 15 pounds of your ideal weight before pregnancy. Remember, pregnancy is not a time to be dieting! Do not stop eating or start skipping meals as your weight increases. Both you and your baby need the calories and nutrition you receive from a healthy diet. Be sure to consult with your doctor about your diet. There is a formula and diet plan available depending on whether you are overweight or underweight. Your caregiver or nutritionist can help and advise you if necessary. X Avoid X-rays. If you must have dental work or diagnostic tests, tell your dentist or physician that you are pregnant so that extra care can be taken. X-rays should only be taken when  the risks of not taking them outweigh the risk of taking them. If needed, only the minimum amount of radiation should be used. When X-rays are necessary, protective lead shields should be used to cover areas of the body that are not being X-rayed. Y Your baby loves you. Breastfeeding your baby creates a loving and very close bond between the two of you. Give your baby a healthy environment to live in while you are pregnant. Infants and children require constant care and guidance. Their health and safety should be carefully watched at all times. After the baby is born, rest or take a nap when the baby is sleeping. Z Get your ZZZs. Be sure to get plenty of rest. Resting on your side as often as possible, especially on your left side is advised. It provides the best circulation to your baby and helps reduce swelling. Try taking a nap for 30 to 45 minutes in the afternoon when possible. After the baby is born rest or take a nap when the baby is sleeping. Try elevating your feet for that amount of time when possible. It helps the circulation in your legs and helps reduce swelling.  Most information courtesy of the CDC. Document Released: 05/10/2005 Document Revised: 04/29/2011 Document Reviewed: 01/22/2009 [employer] Patient Information 2012 [employer]. 

## 2012-02-16 LAB — GC/CHLAMYDIA PROBE AMP, GENITAL
Chlamydia, DNA Probe: NEGATIVE
GC Probe Amp, Genital: NEGATIVE

## 2012-02-29 ENCOUNTER — Other Ambulatory Visit: Payer: Self-pay | Admitting: Obstetrics and Gynecology

## 2012-02-29 DIAGNOSIS — Z36 Encounter for antenatal screening of mother: Secondary | ICD-10-CM

## 2012-03-01 ENCOUNTER — Other Ambulatory Visit: Payer: Self-pay

## 2012-03-01 ENCOUNTER — Encounter: Payer: Self-pay | Admitting: Obstetrics and Gynecology

## 2012-03-01 ENCOUNTER — Ambulatory Visit (INDEPENDENT_AMBULATORY_CARE_PROVIDER_SITE_OTHER): Payer: BC Managed Care – PPO

## 2012-03-01 DIAGNOSIS — O283 Abnormal ultrasonic finding on antenatal screening of mother: Secondary | ICD-10-CM

## 2012-03-01 DIAGNOSIS — Z36 Encounter for antenatal screening of mother: Secondary | ICD-10-CM

## 2012-03-01 LAB — US OB COMP LESS 14 WKS

## 2012-03-05 NOTE — Progress Notes (Signed)
  Subjective:    Kathy Stuart is being seen today for her first obstetrical visit.  This is not a planned pregnancy. She is at [redacted]w[redacted]d gestation. Her obstetrical history is significant for advanced maternal age, obesity and hx C/S x2, hx twins. Relationship with FOB: spouse, living together. "Casimiro Needle" they also raise 4 step-children. Pt desires repeat c/s w BTL, Patient does intend to breast feed. Pregnancy history fully reviewed.  Patient reports no complaints. Reports hx of bleeding in early pregnancy, none "in a few weeks"   Review of Systems:   Review of Systems  All other systems reviewed and are negative.    Objective:     BP 120/70  Wt 297 lb (134.718 kg)  LMP 12/02/2011 Physical Exam  Nursing note and vitals reviewed. Constitutional: She is oriented to person, place, and time. She appears well-developed and well-nourished.  HENT:  Head: Normocephalic and atraumatic.  Eyes: Pupils are equal, round, and reactive to light.  Neck: Normal range of motion. Neck supple. No thyromegaly present.  Cardiovascular: Normal rate, regular rhythm and normal heart sounds.   Respiratory: Effort normal and breath sounds normal.  GI: Soft. Bowel sounds are normal.  Genitourinary: Vagina normal.  Musculoskeletal: Normal range of motion. She exhibits no edema.  Neurological: She is alert and oriented to person, place, and time. She has normal reflexes.  Skin: Skin is warm and dry.  Psychiatric: She has a normal mood and affect. Her behavior is normal.    Maternal Exam:  Abdomen: Patient reports no abdominal tenderness. Fundal height is aga.    Introitus: Normal vulva. Normal vagina.  Pelvis: adequate for delivery.   Cervix: Cervix evaluated by sterile speculum exam and digital exam.     Fetal Exam Fetal Monitor Review: Mode: ultrasound.          Assessment:    Pregnancy: [id number] Patient Active Problem List  Diagnosis  . H/O menorrhagia  . Vitamin D deficiency  . UTI  (urinary tract infection) in pregnancy, antepartum  . Hx of cesarean section  . History of twin pregnancy in prior pregnancy  . AMA (advanced maternal age) multigravida 35+  . Abnormal ultrasonic finding on antenatal screening of mother     Dating [place] today c/w LMP, gestational sac in lower 3rd of uterus BMI =43  Denies urinary sx's   Plan:     Initial labs rv'd Bl type O pos, hgb 13.2, UA cx + and pt started Keflex yesterday Prenatal vitamins. Problem list reviewed and updated. Pt desires genetic testing, will do 1st trim screen in 2wks, AFP 16-20wks  Role of ultrasound in pregnancy discussed; fetal survey: requested. Amniocentesis discussed: not indicated at this time  Pap smear due in November, pt reports hx of abnormal pap as a teen, regular since Wet prep neg GC/CT sent Will plan 18wk 1hour gtt secondary to obesity  Plan to have pt sign BTL papers 3rd trimester Pt desires repeat c/s   rv'd labs, diet, and exercise When to call   Follow up in 2 weeks.    Malissa Hippo 03/05/2012

## 2012-03-14 ENCOUNTER — Encounter: Payer: BC Managed Care – PPO | Admitting: Obstetrics and Gynecology

## 2012-03-14 ENCOUNTER — Ambulatory Visit (INDEPENDENT_AMBULATORY_CARE_PROVIDER_SITE_OTHER): Payer: BC Managed Care – PPO | Admitting: Obstetrics and Gynecology

## 2012-03-14 ENCOUNTER — Other Ambulatory Visit: Payer: Self-pay | Admitting: Obstetrics and Gynecology

## 2012-03-14 ENCOUNTER — Ambulatory Visit (INDEPENDENT_AMBULATORY_CARE_PROVIDER_SITE_OTHER): Payer: BC Managed Care – PPO

## 2012-03-14 VITALS — BP 112/72 | Wt 302.0 lb

## 2012-03-14 DIAGNOSIS — O283 Abnormal ultrasonic finding on antenatal screening of mother: Secondary | ICD-10-CM

## 2012-03-14 DIAGNOSIS — Z3490 Encounter for supervision of normal pregnancy, unspecified, unspecified trimester: Secondary | ICD-10-CM

## 2012-03-14 DIAGNOSIS — Z331 Pregnant state, incidental: Secondary | ICD-10-CM

## 2012-03-14 DIAGNOSIS — O289 Unspecified abnormal findings on antenatal screening of mother: Secondary | ICD-10-CM

## 2012-03-14 LAB — US OB TRANSVAGINAL

## 2012-03-14 LAB — US OB LIMITED

## 2012-03-14 NOTE — Progress Notes (Signed)
Pt stated no issues today.  

## 2012-03-14 NOTE — Progress Notes (Signed)
[redacted]w[redacted]d USS today: Result: Transvaginal and transabdominal Vx presentation FHT's 160 bpm Anterior placenta placenta upper begins mid - uterus. Lower edge is seen today at the internal Cx os. Good color flow os observed behind the placenta in the LUS aat the time os USS. Patient is at risk placenta accreta with Hx of 2 previous C/S x 2  Fluid normal Cx closed Normal adnexa F/U Anatomy USS survey 18 - 19 weeks Glucola at 18 weeks due to BMI No complaints. No change vaginal secretions FH = to dates ROB x 3 weeks Anatomy USS booked and for Glucola

## 2012-04-04 ENCOUNTER — Other Ambulatory Visit: Payer: Self-pay

## 2012-04-04 DIAGNOSIS — Z3689 Encounter for other specified antenatal screening: Secondary | ICD-10-CM

## 2012-04-05 ENCOUNTER — Other Ambulatory Visit: Payer: BC Managed Care – PPO

## 2012-04-05 ENCOUNTER — Encounter: Payer: Self-pay | Admitting: Obstetrics and Gynecology

## 2012-04-05 ENCOUNTER — Ambulatory Visit (INDEPENDENT_AMBULATORY_CARE_PROVIDER_SITE_OTHER): Payer: BC Managed Care – PPO | Admitting: Obstetrics and Gynecology

## 2012-04-05 ENCOUNTER — Ambulatory Visit: Payer: BC Managed Care – PPO

## 2012-04-05 VITALS — BP 122/76 | Wt 309.0 lb

## 2012-04-05 DIAGNOSIS — Z23 Encounter for immunization: Secondary | ICD-10-CM

## 2012-04-05 DIAGNOSIS — Z331 Pregnant state, incidental: Secondary | ICD-10-CM

## 2012-04-05 DIAGNOSIS — Z3689 Encounter for other specified antenatal screening: Secondary | ICD-10-CM

## 2012-04-05 LAB — HEMOGLOBIN: Hemoglobin: 11.9 g/dL — ABNORMAL LOW (ref 12.0–15.0)

## 2012-04-05 NOTE — Progress Notes (Signed)
[redacted]w[redacted]d AFP and glucola today first trimester screen normal Flu vaccine today Anatomy US @ [place]

## 2012-04-05 NOTE — Progress Notes (Signed)
glucola given today 

## 2012-04-06 LAB — ALPHA FETOPROTEIN, MATERNAL
AFP: 17.4 IU/mL
Curr Gest Age: 17.6 wks.days
MoM for AFP: 0.76
Osb Risk: <1:54600 {titer}

## 2012-04-19 ENCOUNTER — Other Ambulatory Visit: Payer: Self-pay

## 2012-04-19 DIAGNOSIS — Z3689 Encounter for other specified antenatal screening: Secondary | ICD-10-CM

## 2012-04-24 ENCOUNTER — Ambulatory Visit (INDEPENDENT_AMBULATORY_CARE_PROVIDER_SITE_OTHER): Payer: BC Managed Care – PPO

## 2012-04-24 ENCOUNTER — Encounter: Payer: Self-pay | Admitting: Obstetrics and Gynecology

## 2012-04-24 ENCOUNTER — Other Ambulatory Visit: Payer: Self-pay | Admitting: Obstetrics and Gynecology

## 2012-04-24 ENCOUNTER — Ambulatory Visit (INDEPENDENT_AMBULATORY_CARE_PROVIDER_SITE_OTHER): Payer: BC Managed Care – PPO | Admitting: Obstetrics and Gynecology

## 2012-04-24 VITALS — BP 110/64 | Wt 313.0 lb

## 2012-04-24 DIAGNOSIS — Z3689 Encounter for other specified antenatal screening: Secondary | ICD-10-CM

## 2012-04-24 DIAGNOSIS — E669 Obesity, unspecified: Secondary | ICD-10-CM

## 2012-04-24 DIAGNOSIS — O09529 Supervision of elderly multigravida, unspecified trimester: Secondary | ICD-10-CM

## 2012-04-24 DIAGNOSIS — Z331 Pregnant state, incidental: Secondary | ICD-10-CM

## 2012-04-24 NOTE — Addendum Note (Signed)
Addended by: Rolla Plate on: 04/24/2012 11:37 AM   Modules accepted: Orders

## 2012-04-24 NOTE — Progress Notes (Signed)
Pt w/o complaint today.  

## 2012-04-24 NOTE — Progress Notes (Signed)
[place] for anatomy S=d cx 3.94 cm TVS presentation FLPK 7.2 cm AFP normal One hr gtt 106 repeat at 28 weeks All anatomy not seen pt to have f/u anatomy at [facility]

## 2012-04-27 LAB — US OB DETAIL + 14 WK

## 2012-04-27 LAB — US OB TRANSVAGINAL

## 2012-05-08 ENCOUNTER — Telehealth: Payer: Self-pay | Admitting: Obstetrics and Gynecology

## 2012-05-08 ENCOUNTER — Other Ambulatory Visit: Payer: Self-pay | Admitting: Obstetrics and Gynecology

## 2012-05-08 DIAGNOSIS — Z3689 Encounter for other specified antenatal screening: Secondary | ICD-10-CM

## 2012-05-08 NOTE — Telephone Encounter (Signed)
Tc to [facility] Ultrasound Department, spoke w/ Olegario Messier, informed order released for pt's [place] tomorrow, checking to see if they received, per Olegario Messier she does see the order.

## 2012-05-09 ENCOUNTER — Ambulatory Visit (HOSPITAL_COMMUNITY)
Admission: RE | Admit: 2012-05-09 | Discharge: 2012-05-09 | Disposition: A | Discharge status: Home / Self Care | Payer: BC Managed Care – PPO | Source: Ambulatory Visit | Attending: Obstetrics and Gynecology | Admitting: Obstetrics and Gynecology

## 2012-05-09 ENCOUNTER — Other Ambulatory Visit: Payer: Self-pay | Admitting: Obstetrics and Gynecology

## 2012-05-09 DIAGNOSIS — O09529 Supervision of elderly multigravida, unspecified trimester: Secondary | ICD-10-CM | POA: Insufficient documentation

## 2012-05-09 DIAGNOSIS — O34219 Maternal care for unspecified type scar from previous cesarean delivery: Secondary | ICD-10-CM | POA: Insufficient documentation

## 2012-05-09 DIAGNOSIS — Z3689 Encounter for other specified antenatal screening: Secondary | ICD-10-CM | POA: Insufficient documentation

## 2012-05-09 DIAGNOSIS — E669 Obesity, unspecified: Secondary | ICD-10-CM | POA: Insufficient documentation

## 2012-05-11 ENCOUNTER — Encounter: Payer: Self-pay | Admitting: Obstetrics and Gynecology

## 2012-05-16 ENCOUNTER — Encounter: Payer: BC Managed Care – PPO | Admitting: Obstetrics and Gynecology

## 2012-05-26 ENCOUNTER — Ambulatory Visit (INDEPENDENT_AMBULATORY_CARE_PROVIDER_SITE_OTHER): Payer: BC Managed Care – PPO | Admitting: Obstetrics and Gynecology

## 2012-05-26 ENCOUNTER — Encounter: Payer: Self-pay | Admitting: Obstetrics and Gynecology

## 2012-05-26 VITALS — BP 110/70 | Wt 324.0 lb

## 2012-05-26 DIAGNOSIS — Z331 Pregnant state, incidental: Secondary | ICD-10-CM

## 2012-05-26 DIAGNOSIS — Z3490 Encounter for supervision of normal pregnancy, unspecified, unspecified trimester: Secondary | ICD-10-CM

## 2012-05-26 NOTE — Progress Notes (Signed)
[redacted]w[redacted]d No concerns per pt

## 2012-05-26 NOTE — Progress Notes (Signed)
Pt feeling well. Wearing brace on wrist for carpel tunnel syndrome. Difficult measuring fundal height due to habitus. Used informal [place] to assess fetal movement due to difficulty verifying FHR. Discussed next appt with AR to plan CS and BTL. Glucola next visit.

## 2012-06-16 ENCOUNTER — Encounter: Payer: Self-pay | Admitting: Obstetrics and Gynecology

## 2012-06-16 ENCOUNTER — Ambulatory Visit: Payer: BC Managed Care – PPO | Admitting: Obstetrics and Gynecology

## 2012-06-16 ENCOUNTER — Other Ambulatory Visit: Payer: BC Managed Care – PPO

## 2012-06-16 VITALS — BP 102/60 | Wt 320.0 lb

## 2012-06-16 DIAGNOSIS — Z3480 Encounter for supervision of other normal pregnancy, unspecified trimester: Secondary | ICD-10-CM

## 2012-06-16 DIAGNOSIS — Z98891 History of uterine scar from previous surgery: Secondary | ICD-10-CM

## 2012-06-16 LAB — CBC
HCT: 34.3 % — ABNORMAL LOW (ref 36.0–46.0)
Hemoglobin: 11.4 g/dL — ABNORMAL LOW (ref 12.0–15.0)
RBC: 4.15 MIL/uL (ref 3.87–5.11)
WBC: 7.8 10*3/uL (ref 4.0–10.5)

## 2012-06-16 NOTE — Progress Notes (Signed)
[redacted]w[redacted]d Glucola, CBC, RPR today Pt wants repeat c/s and BTL (has 5 kids at home already) Will schedule at 39wks Consent s/w

## 2012-06-17 LAB — SYPHILIS: RPR W/REFLEX TO RPR TITER AND TREPONEMAL ANTIBODIES, TRADITIONAL SCREENING AND DIAGNOSIS ALGORITHM

## 2012-06-17 LAB — GLUCOSE TOLERANCE, 1 HOUR (50G) W/O FASTING: Glucose, 1 Hour GTT: 102 mg/dL (ref 70–140)

## 2012-06-19 ENCOUNTER — Telehealth: Payer: Self-pay

## 2012-06-19 NOTE — Telephone Encounter (Signed)
LM for pt to cb re: test results. Melody Comas A

## 2012-06-19 NOTE — Telephone Encounter (Signed)
Spoke to pt to notify her of Low iron. Rec iron supplement daily. Pt understands. Kathy Stuart A

## 2012-06-28 ENCOUNTER — Encounter: Payer: Self-pay | Admitting: Obstetrics and Gynecology

## 2012-06-28 ENCOUNTER — Ambulatory Visit: Payer: BC Managed Care – PPO | Admitting: Obstetrics and Gynecology

## 2012-06-28 VITALS — BP 118/60 | Wt 321.0 lb

## 2012-06-28 DIAGNOSIS — Z331 Pregnant state, incidental: Secondary | ICD-10-CM

## 2012-06-28 NOTE — Progress Notes (Signed)
[redacted]w[redacted]d Results for orders placed in visit on 06/16/12  GLUCOSE TOLERANCE, 1 HOUR (50G) W/O FASTING      Component Value Range   Glucose, 1 Hour GTT 102  70 - 140 mg/dL  CBC      Component Value Range   WBC 7.8  4.0 - 10.5 K/uL   RBC 4.15  3.87 - 5.11 MIL/uL   Hemoglobin 11.4 (*) 12.0 - 15.0 g/dL   HCT 95.2 (*) 84.1 - 32.4 %   MCV 82.7  78.0 - 100.0 fL   MCH 27.5  26.0 - 34.0 pg   MCHC 33.2  30.0 - 36.0 g/dL   RDW 40.1 (*) 02.7 - 25.3 %   Platelets 189  150 - 400 K/uL  RPR      Component Value Range   RPR NON REAC  NON REAC  pt with LLQ pain.  No hernia c/w round ligament pain [place] @[facility]  S>D

## 2012-06-28 NOTE — Progress Notes (Signed)
Pt feels like she has pulled muscles on the left side of her stomach.

## 2012-06-28 NOTE — Patient Instructions (Signed)
Round Ligament Pain  The round ligament is made up of muscle and fibrous tissue. It is attached to the uterus near the fallopian tube. The round ligament is located on both sides of the uterus and helps support the position of the uterus. It usually begins in the second trimester of pregnancy when the uterus comes out of the pelvis. The pain can come and go until the baby is delivered. Round ligament pain is not a serious problem and does not cause harm to the baby.  CAUSE  During pregnancy the uterus grows the most from the second trimester to delivery. As it grows, it stretches and slightly twists the round ligaments. When the uterus leans from one side to the other, the round ligament on the opposite side pulls and stretches. This can cause pain.  SYMPTOMS   Pain can occur on one side or both sides. The pain is usually a short, sharp, and pinching-like. Sometimes it can be a dull, lingering and aching pain. The pain is located in the lower side of the abdomen or in the groin. The pain is internal and usually starts deep in the groin and moves up to the outside of the hip area. Pain can occur with:  · Sudden change in position like getting out of bed or a chair.  · Rolling over in bed.  · Coughing or sneezing.  · Walking too much.  · Any type of physical activity.  DIAGNOSIS   Your caregiver will make sure there are no serious problems causing the pain. When nothing serious is found, the symptoms usually indicate that the pain is from the round ligament.  TREATMENT   · Sit down and relax when the pain starts.  · Flex your knees up to your belly.  · Lay on your side with a pillow under your belly (abdomen) and another one between your legs.  · Sit in a hot bath for 15 to 20 minutes or until the pain goes away.  HOME CARE INSTRUCTIONS   · Only take over-the-counter or prescriptions medicines for pain, discomfort or fever as directed by your caregiver.  · Sit and stand slowly.  · Avoid long walks if it causes  pain.  · Stop or lessen your physical activities if it causes pain.  SEEK MEDICAL CARE IF:   · The pain does not go away with any of your treatment.  · You need stronger medication for the pain.  · You develop back pain that you did not have before with the side pain.  SEEK IMMEDIATE MEDICAL CARE IF:   · You develop a temperature of 102° F (38.9° C) or higher.  · You develop uterine contractions.  · You develop vaginal bleeding.  · You develop nausea, vomiting or diarrhea.  · You develop chills.  · You have pain when you urinate.  Document Released: 02/17/2008 Document Revised: 08/02/2011 Document Reviewed: 02/17/2008  Exit[employer]® Patient Information ©2013 [employer].

## 2012-07-05 ENCOUNTER — Telehealth: Payer: Self-pay | Admitting: Obstetrics and Gynecology

## 2012-07-05 NOTE — Telephone Encounter (Signed)
Repeat C/S & BTL scheduled for 08/31/12 @ 9:30 with AR. -Adrianne Pridgen

## 2012-07-10 ENCOUNTER — Other Ambulatory Visit: Payer: Self-pay

## 2012-07-10 DIAGNOSIS — O26849 Uterine size-date discrepancy, unspecified trimester: Secondary | ICD-10-CM

## 2012-07-11 ENCOUNTER — Ambulatory Visit: Payer: BC Managed Care – PPO | Admitting: Obstetrics and Gynecology

## 2012-07-11 ENCOUNTER — Ambulatory Visit: Payer: BC Managed Care – PPO

## 2012-07-11 VITALS — BP 112/58 | Wt 322.0 lb

## 2012-07-11 DIAGNOSIS — Z98891 History of uterine scar from previous surgery: Secondary | ICD-10-CM

## 2012-07-11 DIAGNOSIS — Z6841 Body Mass Index (BMI) 40.0 and over, adult: Secondary | ICD-10-CM | POA: Insufficient documentation

## 2012-07-11 DIAGNOSIS — O26849 Uterine size-date discrepancy, unspecified trimester: Secondary | ICD-10-CM

## 2012-07-11 DIAGNOSIS — Z331 Pregnant state, incidental: Secondary | ICD-10-CM

## 2012-07-11 LAB — US OB FOLLOW UP

## 2012-07-11 NOTE — Progress Notes (Signed)
[redacted]w[redacted]d [place] rv'd EFW 82% rv'd FKC and PTL Has c/s scheduled plans BTL Repeat growth [place] 4wks secondary to inaccurate FH due to body habitus

## 2012-07-11 NOTE — Progress Notes (Signed)
Pt stated no issues today.  Ultrasound shows:  SIUP  S=D     [place] EDD: 09/07/2012           AFI: 21.64           Cervical length: 3.67 cm           Placenta localization: anterior           Fetal presentation: vertex                   Anatomy survey is normal           Gender : female Comments: AFI is 85th % tile Normal ovaries,no fluid in CDS,normal adenexas.

## 2012-07-12 ENCOUNTER — Other Ambulatory Visit: Payer: Self-pay | Admitting: Obstetrics and Gynecology

## 2012-07-25 ENCOUNTER — Encounter: Payer: Self-pay | Admitting: Obstetrics and Gynecology

## 2012-07-25 ENCOUNTER — Ambulatory Visit: Payer: BC Managed Care – PPO | Admitting: Obstetrics and Gynecology

## 2012-07-25 VITALS — BP 116/70 | Wt 327.0 lb

## 2012-07-25 DIAGNOSIS — Z331 Pregnant state, incidental: Secondary | ICD-10-CM

## 2012-07-25 NOTE — Progress Notes (Signed)
Pt states she would like cx check, due to very intense round ligament pain along with strong BH ctx. Melody Comas A

## 2012-07-25 NOTE — Progress Notes (Signed)
[redacted]w[redacted]d Pt states she is having intense b/h and ligament pain. Some days pt say the feeling is every 30 min.

## 2012-07-25 NOTE — Progress Notes (Signed)
[redacted]w[redacted]d A/P GBS @NV  Fetal kick counts reviewed Labor reviewed with pt All patients  questions answered [place] for growth @NV  BPP with [place] because of increase BMI

## 2012-08-10 ENCOUNTER — Other Ambulatory Visit: Payer: BC Managed Care – PPO

## 2012-08-10 ENCOUNTER — Ambulatory Visit: Payer: BC Managed Care – PPO | Admitting: Obstetrics and Gynecology

## 2012-08-10 ENCOUNTER — Other Ambulatory Visit: Payer: Self-pay | Admitting: Obstetrics and Gynecology

## 2012-08-10 VITALS — BP 108/64 | Wt 332.0 lb

## 2012-08-10 DIAGNOSIS — Z331 Pregnant state, incidental: Secondary | ICD-10-CM

## 2012-08-10 DIAGNOSIS — IMO0002 Reserved for concepts with insufficient information to code with codable children: Secondary | ICD-10-CM | POA: Insufficient documentation

## 2012-08-10 DIAGNOSIS — O26849 Uterine size-date discrepancy, unspecified trimester: Secondary | ICD-10-CM

## 2012-08-10 LAB — US OB FOLLOW UP

## 2012-08-10 NOTE — Progress Notes (Signed)
Doing well.  Good FM.  Much less pelvic pain now. Scheduled for repeat C/S with BTL on 08/31/12. [place] today:  Vtx, EFW 7+10, 95.6%ile, AFI 19.74, 75%ile. GBS today

## 2012-08-10 NOTE — Progress Notes (Signed)
[redacted]w[redacted]d GBS today.

## 2012-08-10 NOTE — Progress Notes (Signed)
[redacted]w[redacted]d Growth Ultrasound shows:  SIUP  S>D     [place] EDD: 08/22/12           AFI: 19.74 cm normal 75th%tile           Placenta localization: anterior           Fetal presentation: presentation

## 2012-08-26 ENCOUNTER — Encounter (HOSPITAL_COMMUNITY): Payer: Self-pay | Admitting: Pharmacy Technician

## 2012-08-28 ENCOUNTER — Other Ambulatory Visit: Payer: Self-pay | Admitting: Obstetrics and Gynecology

## 2012-08-29 ENCOUNTER — Encounter (HOSPITAL_COMMUNITY): Payer: Self-pay

## 2012-08-29 ENCOUNTER — Encounter (HOSPITAL_COMMUNITY)
Admission: RE | Admit: 2012-08-29 | Discharge: 2012-08-29 | Disposition: A | Discharge status: Home / Self Care | Payer: BC Managed Care – PPO | Source: Ambulatory Visit | Attending: Obstetrics and Gynecology | Admitting: Obstetrics and Gynecology

## 2012-08-29 LAB — CBC
Hemoglobin: 12.4 g/dL (ref 12.0–15.0)
MCH: 27.6 pg (ref 26.0–34.0)
MCV: 85.1 fL (ref 78.0–100.0)
Platelets: 175 10*3/uL (ref 150–400)
RBC: 4.5 MIL/uL (ref 3.87–5.11)
WBC: 8.6 10*3/uL (ref 4.0–10.5)

## 2012-08-29 LAB — RPR: RPR Ser Ql: NONREACTIVE

## 2012-08-29 LAB — ABO/RH: ABO/RH(D): O POS

## 2012-08-29 NOTE — Patient Instructions (Addendum)
20 Kathy Stuart  08/29/2012   Your procedure is scheduled on:  08/31/12  Enter through the Main Entrance of [facility] at 8 AM.  Pick up the phone at the desk and dial [phone].   Call this number if you have problems the morning of surgery: [phone]   Remember:   Do not eat food:After Midnight.  Do not drink clear liquids: After Midnight.  Take these medicines the morning of surgery with A SIP OF WATER: NA   Do not wear jewelry, make-up or nail polish.  Do not wear lotions, powders, or perfumes. You may wear deodorant.  Do not shave 48 hours prior to surgery.  Do not bring valuables to the hospital.  Contacts, dentures or bridgework may not be worn into surgery.  Leave suitcase in the car. After surgery it may be brought to your room.  For patients admitted to the hospital, checkout time is 11:00 AM the day of discharge.   Patients discharged the day of surgery will not be allowed to drive home.  Name and phone number of your driver: NA  Special Instructions: Shower using CHG 2 nights before surgery and the night before surgery.  If you shower the day of surgery use CHG.  Use special wash - you have one bottle of CHG for all showers.  You should use approximately 1/3 of the bottle for each shower.   Please read over the following fact sheets that you were given: Surgical Site Infection Prevention

## 2012-08-30 MED ORDER — DEXTROSE 5 % IV SOLN
3.0000 g | INTRAVENOUS | Status: AC
  Administered 2012-08-31: 3 g via INTRAVENOUS
  Filled 2012-08-30: qty 3000

## 2012-08-31 ENCOUNTER — Inpatient Hospital Stay (HOSPITAL_COMMUNITY)
Admission: RE | Admit: 2012-08-31 | Discharge: 2012-09-03 | DRG: 370 | Disposition: A | Discharge status: Home / Self Care | Payer: BC Managed Care – PPO | Source: Ambulatory Visit | Attending: Obstetrics and Gynecology | Admitting: Obstetrics and Gynecology

## 2012-08-31 ENCOUNTER — Inpatient Hospital Stay (HOSPITAL_COMMUNITY): Payer: BC Managed Care – PPO | Admitting: Anesthesiology

## 2012-08-31 ENCOUNTER — Encounter (HOSPITAL_COMMUNITY)
Admission: RE | Disposition: A | Discharge status: Home / Self Care | Payer: Self-pay | Source: Ambulatory Visit | Attending: Obstetrics and Gynecology

## 2012-08-31 ENCOUNTER — Encounter (HOSPITAL_COMMUNITY): Payer: Self-pay | Admitting: Anesthesiology

## 2012-08-31 DIAGNOSIS — Z6841 Body Mass Index (BMI) 40.0 and over, adult: Secondary | ICD-10-CM

## 2012-08-31 DIAGNOSIS — O09529 Supervision of elderly multigravida, unspecified trimester: Secondary | ICD-10-CM | POA: Diagnosis present

## 2012-08-31 DIAGNOSIS — O34219 Maternal care for unspecified type scar from previous cesarean delivery: Principal | ICD-10-CM | POA: Diagnosis present

## 2012-08-31 DIAGNOSIS — O2340 Unspecified infection of urinary tract in pregnancy, unspecified trimester: Secondary | ICD-10-CM

## 2012-08-31 DIAGNOSIS — Z302 Encounter for sterilization: Secondary | ICD-10-CM

## 2012-08-31 DIAGNOSIS — D649 Anemia, unspecified: Secondary | ICD-10-CM | POA: Diagnosis present

## 2012-08-31 DIAGNOSIS — Z98891 History of uterine scar from previous surgery: Secondary | ICD-10-CM

## 2012-08-31 DIAGNOSIS — D259 Leiomyoma of uterus, unspecified: Secondary | ICD-10-CM | POA: Diagnosis present

## 2012-08-31 DIAGNOSIS — Z8759 Personal history of other complications of pregnancy, childbirth and the puerperium: Secondary | ICD-10-CM

## 2012-08-31 DIAGNOSIS — O34599 Maternal care for other abnormalities of gravid uterus, unspecified trimester: Secondary | ICD-10-CM | POA: Diagnosis present

## 2012-08-31 DIAGNOSIS — E669 Obesity, unspecified: Secondary | ICD-10-CM | POA: Diagnosis present

## 2012-08-31 DIAGNOSIS — O9903 Anemia complicating the puerperium: Secondary | ICD-10-CM | POA: Diagnosis present

## 2012-08-31 DIAGNOSIS — E559 Vitamin D deficiency, unspecified: Secondary | ICD-10-CM

## 2012-08-31 DIAGNOSIS — D4959 Neoplasm of unspecified behavior of other genitourinary organ: Secondary | ICD-10-CM | POA: Diagnosis present

## 2012-08-31 HISTORY — DX: History of uterine scar from previous surgery: Z98.891

## 2012-08-31 LAB — CBC
HCT: 36.2 % (ref 36.0–46.0)
Hemoglobin: 11.8 g/dL — ABNORMAL LOW (ref 12.0–15.0)
MCH: 27.8 pg (ref 26.0–34.0)
MCHC: 32.6 g/dL (ref 30.0–36.0)
MCV: 85.2 fL (ref 78.0–100.0)

## 2012-08-31 LAB — PREPARE RBC (CROSSMATCH)

## 2012-08-31 SURGERY — Surgical Case
Anesthesia: Spinal | Site: Abdomen | Laterality: Bilateral | Wound class: Clean Contaminated

## 2012-08-31 MED ORDER — PHENYLEPHRINE HCL 10 MG/ML IJ SOLN
INTRAMUSCULAR | Status: DC | PRN
  Administered 2012-08-31: 80 ug via INTRAVENOUS
  Administered 2012-08-31 (×3): 40 ug via INTRAVENOUS

## 2012-08-31 MED ORDER — BUPIVACAINE HCL (PF) 0.5 % IJ SOLN
INTRAMUSCULAR | Status: AC
  Filled 2012-08-31: qty 30

## 2012-08-31 MED ORDER — MORPHINE SULFATE 0.5 MG/ML IJ SOLN
INTRAMUSCULAR | Status: AC
  Filled 2012-08-31: qty 10

## 2012-08-31 MED ORDER — EPHEDRINE 5 MG/ML INJ
INTRAVENOUS | Status: AC
  Filled 2012-08-31: qty 10

## 2012-08-31 MED ORDER — NALBUPHINE HCL 10 MG/ML IJ SOLN
5.0000 mg | INTRAMUSCULAR | Status: DC | PRN
  Filled 2012-08-31: qty 1

## 2012-08-31 MED ORDER — LANOLIN HYDROUS EX OINT: 1.0000 "application " | TOPICAL_OINTMENT | CUTANEOUS | Status: DC | PRN

## 2012-08-31 MED ORDER — OXYTOCIN 40 UNITS IN LACTATED RINGERS INFUSION - SIMPLE MED
62.5000 mL/h | INTRAVENOUS | Status: AC
  Administered 2012-08-31: 62.5 mL/h via INTRAVENOUS
  Filled 2012-08-31: qty 1000

## 2012-08-31 MED ORDER — TETANUS-DIPHTH-ACELL PERTUSSIS 5-2.5-18.5 LF-MCG/0.5 IM SUSP
0.5000 mL | Freq: Once | INTRAMUSCULAR | Status: AC
  Administered 2012-09-01: 0.5 mL via INTRAMUSCULAR

## 2012-08-31 MED ORDER — SENNOSIDES-DOCUSATE SODIUM 8.6-50 MG PO TABS
2.0000 | ORAL_TABLET | Freq: Every day | ORAL | Status: DC
  Administered 2012-09-01 – 2012-09-02 (×2): 2 via ORAL

## 2012-08-31 MED ORDER — SCOPOLAMINE 1 MG/3DAYS TD PT72
MEDICATED_PATCH | TRANSDERMAL | Status: AC
  Administered 2012-08-31: 1.5 mg via TRANSDERMAL
  Filled 2012-08-31: qty 1

## 2012-08-31 MED ORDER — KETOROLAC TROMETHAMINE 30 MG/ML IJ SOLN
INTRAMUSCULAR | Status: AC
  Administered 2012-08-31: 30 mg via INTRAMUSCULAR
  Filled 2012-08-31: qty 1

## 2012-08-31 MED ORDER — IBUPROFEN 600 MG PO TABS
600.0000 mg | ORAL_TABLET | Freq: Four times a day (QID) | ORAL | Status: DC
  Administered 2012-09-01 – 2012-09-03 (×8): 600 mg via ORAL
  Filled 2012-08-31 (×8): qty 1

## 2012-08-31 MED ORDER — PHENYLEPHRINE 40 MCG/ML (10ML) SYRINGE FOR IV PUSH (FOR BLOOD PRESSURE SUPPORT)
PREFILLED_SYRINGE | INTRAVENOUS | Status: AC
  Filled 2012-08-31: qty 5

## 2012-08-31 MED ORDER — ONDANSETRON HCL 4 MG/2ML IJ SOLN
INTRAMUSCULAR | Status: DC | PRN
  Administered 2012-08-31: 4 mg via INTRAVENOUS

## 2012-08-31 MED ORDER — ONDANSETRON HCL 4 MG/2ML IJ SOLN
INTRAMUSCULAR | Status: AC
  Filled 2012-08-31: qty 2

## 2012-08-31 MED ORDER — NALOXONE HCL 1 MG/ML IJ SOLN
1.0000 ug/kg/h | INTRAVENOUS | Status: DC | PRN
  Filled 2012-08-31: qty 2

## 2012-08-31 MED ORDER — EPHEDRINE SULFATE 50 MG/ML IJ SOLN
INTRAMUSCULAR | Status: DC | PRN
  Administered 2012-08-31: 10 mg via INTRAVENOUS
  Administered 2012-08-31: 5 mg via INTRAVENOUS
  Administered 2012-08-31: 15 mg via INTRAVENOUS

## 2012-08-31 MED ORDER — LACTATED RINGERS IV SOLN
INTRAVENOUS | Status: DC
  Administered 2012-08-31 (×3): via INTRAVENOUS

## 2012-08-31 MED ORDER — LACTATED RINGERS IV SOLN
INTRAVENOUS | Status: DC | PRN
  Administered 2012-08-31: 10:00:00 via INTRAVENOUS

## 2012-08-31 MED ORDER — POLYSACCHARIDE IRON COMPLEX 150 MG PO CAPS
150.0000 mg | ORAL_CAPSULE | Freq: Two times a day (BID) | ORAL | Status: DC
  Administered 2012-08-31 – 2012-09-03 (×6): 150 mg via ORAL
  Filled 2012-08-31 (×6): qty 1

## 2012-08-31 MED ORDER — KETOROLAC TROMETHAMINE 30 MG/ML IJ SOLN: 30.0000 mg | Freq: Four times a day (QID) | INTRAMUSCULAR | Status: AC | PRN

## 2012-08-31 MED ORDER — DIPHENHYDRAMINE HCL 50 MG/ML IJ SOLN: 12.5000 mg | INTRAMUSCULAR | Status: DC | PRN

## 2012-08-31 MED ORDER — ZOLPIDEM TARTRATE 5 MG PO TABS: 5.0000 mg | ORAL_TABLET | Freq: Every evening | ORAL | Status: DC | PRN

## 2012-08-31 MED ORDER — DIPHENHYDRAMINE HCL 25 MG PO CAPS: 25.0000 mg | ORAL_CAPSULE | ORAL | Status: DC | PRN

## 2012-08-31 MED ORDER — BUPIVACAINE-EPINEPHRINE (PF) 0.5% -1:200000 IJ SOLN
INTRAMUSCULAR | Status: AC
  Filled 2012-08-31: qty 10

## 2012-08-31 MED ORDER — FENTANYL CITRATE 0.05 MG/ML IJ SOLN
INTRAMUSCULAR | Status: DC | PRN
  Administered 2012-08-31: 25 ug via INTRATHECAL

## 2012-08-31 MED ORDER — SODIUM CHLORIDE 0.9 % IJ SOLN
3.0000 mL | INTRAMUSCULAR | Status: DC | PRN
  Administered 2012-09-01 – 2012-09-02 (×3): 3 mL via INTRAVENOUS

## 2012-08-31 MED ORDER — METOCLOPRAMIDE HCL 5 MG/ML IJ SOLN: 10.0000 mg | Freq: Three times a day (TID) | INTRAMUSCULAR | Status: DC | PRN

## 2012-08-31 MED ORDER — ONDANSETRON HCL 4 MG/2ML IJ SOLN: 4.0000 mg | INTRAMUSCULAR | Status: DC | PRN

## 2012-08-31 MED ORDER — SCOPOLAMINE 1 MG/3DAYS TD PT72: 1.0000 | MEDICATED_PATCH | Freq: Once | TRANSDERMAL | Status: DC

## 2012-08-31 MED ORDER — FENTANYL CITRATE 0.05 MG/ML IJ SOLN
INTRAMUSCULAR | Status: AC
  Filled 2012-08-31: qty 2

## 2012-08-31 MED ORDER — DIPHENHYDRAMINE HCL 25 MG PO CAPS: 25.0000 mg | ORAL_CAPSULE | Freq: Four times a day (QID) | ORAL | Status: DC | PRN

## 2012-08-31 MED ORDER — PRENATAL MULTIVITAMIN CH
1.0000 | ORAL_TABLET | Freq: Every day | ORAL | Status: DC
  Administered 2012-09-01 – 2012-09-02 (×2): 1 via ORAL
  Filled 2012-08-31 (×2): qty 1

## 2012-08-31 MED ORDER — OXYCODONE-ACETAMINOPHEN 5-325 MG PO TABS
1.0000 | ORAL_TABLET | ORAL | Status: DC | PRN
  Administered 2012-09-01 – 2012-09-03 (×7): 1 via ORAL
  Filled 2012-08-31 (×8): qty 1

## 2012-08-31 MED ORDER — MORPHINE SULFATE (PF) 0.5 MG/ML IJ SOLN
INTRAMUSCULAR | Status: DC | PRN
  Administered 2012-08-31: .15 mg via INTRATHECAL

## 2012-08-31 MED ORDER — WITCH HAZEL-GLYCERIN EX PADS: 1.0000 "application " | MEDICATED_PAD | CUTANEOUS | Status: DC | PRN

## 2012-08-31 MED ORDER — ONDANSETRON HCL 4 MG PO TABS: 4.0000 mg | ORAL_TABLET | ORAL | Status: DC | PRN

## 2012-08-31 MED ORDER — ONDANSETRON HCL 4 MG/2ML IJ SOLN: 4.0000 mg | Freq: Three times a day (TID) | INTRAMUSCULAR | Status: DC | PRN

## 2012-08-31 MED ORDER — DIPHENHYDRAMINE HCL 50 MG/ML IJ SOLN: 25.0000 mg | INTRAMUSCULAR | Status: DC | PRN

## 2012-08-31 MED ORDER — LACTATED RINGERS IV SOLN: INTRAVENOUS | Status: DC

## 2012-08-31 MED ORDER — PRENATAL MULTIVITAMIN CH: 1.0000 | ORAL_TABLET | Freq: Every day | ORAL | Status: DC

## 2012-08-31 MED ORDER — NALOXONE HCL 0.4 MG/ML IJ SOLN: 0.4000 mg | INTRAMUSCULAR | Status: DC | PRN

## 2012-08-31 MED ORDER — OXYTOCIN 40 UNITS IN LACTATED RINGERS INFUSION - SIMPLE MED
INTRAVENOUS | Status: DC | PRN
  Administered 2012-08-31: 40 [IU] via INTRAVENOUS

## 2012-08-31 MED ORDER — OXYTOCIN 10 UNIT/ML IJ SOLN
INTRAMUSCULAR | Status: AC
  Filled 2012-08-31: qty 4

## 2012-08-31 MED ORDER — MISOPROSTOL 200 MCG PO TABS
1000.0000 ug | ORAL_TABLET | Freq: Once | ORAL | Status: AC
  Administered 2012-08-31: 1000 ug via VAGINAL
  Filled 2012-08-31: qty 5

## 2012-08-31 MED ORDER — 0.9 % SODIUM CHLORIDE (POUR BTL) OPTIME
TOPICAL | Status: DC | PRN
  Administered 2012-08-31 (×2): 1000 mL

## 2012-08-31 MED ORDER — BUPIVACAINE-EPINEPHRINE 0.5% -1:200000 IJ SOLN
INTRAMUSCULAR | Status: DC | PRN
  Administered 2012-08-31: 10 mL

## 2012-08-31 MED ORDER — BUPIVACAINE IN DEXTROSE 0.75-8.25 % IT SOLN
INTRATHECAL | Status: DC | PRN
  Administered 2012-08-31: 2 mL via INTRATHECAL

## 2012-08-31 MED ORDER — DIBUCAINE 1 % RE OINT: 1.0000 "application " | TOPICAL_OINTMENT | RECTAL | Status: DC | PRN

## 2012-08-31 MED ORDER — SIMETHICONE 80 MG PO CHEW
80.0000 mg | CHEWABLE_TABLET | Freq: Three times a day (TID) | ORAL | Status: DC
  Administered 2012-09-01 – 2012-09-03 (×7): 80 mg via ORAL

## 2012-08-31 MED ORDER — METHYLERGONOVINE MALEATE 0.2 MG/ML IJ SOLN
INTRAMUSCULAR | Status: AC
  Filled 2012-08-31: qty 1

## 2012-08-31 MED ORDER — MEASLES, MUMPS & RUBELLA VAC ~~LOC~~ INJ
0.5000 mL | INJECTION | Freq: Once | SUBCUTANEOUS | Status: DC
  Filled 2012-08-31: qty 0.5

## 2012-08-31 MED ORDER — SIMETHICONE 80 MG PO CHEW
80.0000 mg | CHEWABLE_TABLET | ORAL | Status: DC | PRN
  Administered 2012-09-02: 80 mg via ORAL

## 2012-08-31 MED ORDER — MISOPROSTOL 200 MCG PO TABS
1000.0000 ug | ORAL_TABLET | Freq: Once | ORAL | Status: AC
  Filled 2012-08-31: qty 5

## 2012-08-31 MED ORDER — MENTHOL 3 MG MT LOZG
1.0000 | LOZENGE | OROMUCOSAL | Status: DC | PRN
  Filled 2012-08-31: qty 9

## 2012-08-31 MED ORDER — METHYLERGONOVINE MALEATE 0.2 MG/ML IJ SOLN
0.2000 mg | Freq: Once | INTRAMUSCULAR | Status: AC
  Administered 2012-08-31: 0.2 mg via INTRAMUSCULAR

## 2012-08-31 MED ORDER — MEPERIDINE HCL 25 MG/ML IJ SOLN: 6.2500 mg | INTRAMUSCULAR | Status: DC | PRN

## 2012-08-31 MED ORDER — METHYLERGONOVINE MALEATE 0.2 MG PO TABS
0.2000 mg | ORAL_TABLET | Freq: Four times a day (QID) | ORAL | Status: AC
  Administered 2012-08-31 – 2012-09-02 (×8): 0.2 mg via ORAL
  Filled 2012-08-31 (×8): qty 1

## 2012-08-31 MED ORDER — LACTATED RINGERS IV SOLN
Freq: Once | INTRAVENOUS | Status: AC
  Administered 2012-08-31: 09:00:00 via INTRAVENOUS

## 2012-08-31 MED ORDER — FENTANYL CITRATE 0.05 MG/ML IJ SOLN: 25.0000 ug | INTRAMUSCULAR | Status: DC | PRN

## 2012-08-31 SURGICAL SUPPLY — 39 items
CLOTH BEACON ORANGE TIMEOUT ST (SAFETY) ×2 IMPLANT
CONTAINER PREFILL 10% NBF 15ML (MISCELLANEOUS) IMPLANT
DRAIN JACKSON PRT FLT 7MM (DRAIN) ×2 IMPLANT
DRAPE LG THREE QUARTER DISP (DRAPES) ×2 IMPLANT
DRSG OPSITE POSTOP 4X10 (GAUZE/BANDAGES/DRESSINGS) ×2 IMPLANT
DURAPREP 26ML APPLICATOR (WOUND CARE) ×4 IMPLANT
ELECT REM PT RETURN 9FT ADLT (ELECTROSURGICAL) ×2
ELECTRODE REM PT RTRN 9FT ADLT (ELECTROSURGICAL) ×1 IMPLANT
EVACUATOR SILICONE 100CC (DRAIN) ×2 IMPLANT
EXTRACTOR VACUUM M CUP 4 TUBE (SUCTIONS) ×2 IMPLANT
GLOVE BIOGEL PI IND STRL 8.5 (GLOVE) ×1 IMPLANT
GLOVE BIOGEL PI INDICATOR 8.5 (GLOVE) ×1
GLOVE ECLIPSE 8.0 STRL XLNG CF (GLOVE) ×4 IMPLANT
GOWN PREVENTION PLUS XXLARGE (GOWN DISPOSABLE) ×2 IMPLANT
GOWN STRL REIN XL XLG (GOWN DISPOSABLE) ×6 IMPLANT
KIT ABG SYR 3ML LUER SLIP (SYRINGE) ×4 IMPLANT
NEEDLE HYPO 25X1 1.5 SAFETY (NEEDLE) ×4 IMPLANT
NEEDLE HYPO 25X5/8 SAFETYGLIDE (NEEDLE) IMPLANT
PACK C SECTION WH (CUSTOM PROCEDURE TRAY) ×2 IMPLANT
PAD OB MATERNITY 4.3X12.25 (PERSONAL CARE ITEMS) ×2 IMPLANT
RETRACTOR WND ALEXIS 25 LRG (MISCELLANEOUS) ×1 IMPLANT
RINGERS IRRIG 1000ML POUR BTL (IV SOLUTION) ×2 IMPLANT
RTRCTR WOUND ALEXIS 25CM LRG (MISCELLANEOUS) ×2
SLEEVE SCD COMPRESS KNEE MED (MISCELLANEOUS) IMPLANT
STAPLER VISISTAT 35W (STAPLE) IMPLANT
SUT MNCRL AB 3-0 PS2 27 (SUTURE) IMPLANT
SUT PLAIN 0 NONE (SUTURE) IMPLANT
SUT SILK 3 0 FS 1X18 (SUTURE) IMPLANT
SUT VIC AB 0 CT1 27 (SUTURE) ×3
SUT VIC AB 0 CT1 27XBRD ANBCTR (SUTURE) ×3 IMPLANT
SUT VIC AB 2-0 CTX 36 (SUTURE) ×4 IMPLANT
SUT VIC AB 3-0 CT1 27 (SUTURE)
SUT VIC AB 3-0 CT1 TAPERPNT 27 (SUTURE) IMPLANT
SUT VIC AB 3-0 SH 27 (SUTURE)
SUT VIC AB 3-0 SH 27X BRD (SUTURE) IMPLANT
SYR CONTROL 10ML LL (SYRINGE) ×2 IMPLANT
TOWEL OR 17X24 6PK STRL BLUE (TOWEL DISPOSABLE) ×6 IMPLANT
TRAY FOLEY CATH 14FR (SET/KITS/TRAYS/PACK) ×2 IMPLANT
WATER STERILE IRR 1000ML POUR (IV SOLUTION) ×2 IMPLANT

## 2012-08-31 NOTE — H&P (Signed)
Kathy Stuart is a 37 y.o. female presenting for a repeat cesarean delivery. The patient has been followed at the [facility] [place] [facility]. She desires sterilization.   History OB History   Grav Para Term Preterm Abortions TAB SAB Ect Mult Living   3 2 2      1 3      Obstetric Comments   PTL 2008 X 6 WEEKS; HERNIATED DISC     Past Medical History  Diagnosis Date  . History of toxoplasmosis   . H/O varicella   . Irregular periods/menstrual cycles 2009    Heavy  . Increased BMI 2009  . H/O constipation 2009  . H/O low back pain 2010  . H/O: menorrhagia 2010  . H/O: obesity 2010  . Thyromegaly 2010  . HPV in female   . Abnormal Pap smear 1994    COLPO; LAST PAP 03/2011  . Preterm labor 2008    TWIN PREGNANCY  . Infection     UTI  . Infection     YEAST  . Infection     SINUSITIS 2-3 X/YEAR  . Anemia     As a child; CHRONIC  . Headache     MIGRAINES  . Fibroid 1998  . Recurrent upper respiratory infection (URI)     SINUSITIS 2-3 X/YEAR  . Peripheral vascular disease     VARICOSE VEINS   Past Surgical History  Procedure Laterality Date  . Wisdom tooth extraction    . Cesarean section       x 2   Family History: family history includes Alcohol abuse in her father and mother; Arthritis in her paternal grandmother; Bipolar disorder in her sister; COPD in her father; Depression in her mother; Heart disease in her maternal grandmother and paternal grandfather; Hypertension in her father; Miscarriages / [place] in her sister; Other in her maternal grandmother and mother; and Stroke in her paternal grandmother. Social History:  reports that she has quit smoking. She has never used smokeless tobacco. She reports that she does not drink alcohol or use illicit drugs.   Prenatal Transfer Tool  Maternal Diabetes: No Genetic Screening: Normal Maternal Ultrasounds/Referrals: Normal Fetal Ultrasounds or other  Referrals:  None Maternal Substance Abuse:  No Significant Maternal Medications:  None Significant Maternal Lab Results:  None Other Comments:  None  ROS    Blood pressure 135/65, pulse 91, temperature 97.7 F (36.5 C), temperature source Oral, resp. rate 18, last menstrual period 12/02/2011, SpO2 100.00%. Exam Physical Exam   HEENT: Within normal limits  Chest: Clear  Heart: Regular rate and rhythm  Abdomen: Obese and nontender, gravid  Extremities: Within normal limits  Pelvic exam: cervix closed and long last checked  CBC    Component Value Date/Time   WBC 8.6 08/29/2012 0940   RBC 4.50 08/29/2012 0940   HGB 12.4 08/29/2012 0940   HCT 38.3 08/29/2012 0940   PLT 175 08/29/2012 0940   MCV 85.1 08/29/2012 0940   MCH 27.6 08/29/2012 0940   MCHC 32.4 08/29/2012 0940   RDW 16.9* 08/29/2012 0940   LYMPHSABS 2.9 02/09/2012 1017   MONOABS 0.8 02/09/2012 1017   EOSABS 0.1 02/09/2012 1017   BASOSABS 0.0 02/09/2012 1017    Prenatal labs: ABO, Rh: --/--/O POS, O POS (04/08 0940) Antibody: NEG (04/08 0940) Rubella: 110.2 (09/18 1017) RPR: NON REACTIVE (04/08 0940)  HBsAg: NEGATIVE (09/18 1017)  HIV: NON REACTIVE (09/18 1017)  GBS: NEGATIVE (03/20 1015)  Assessment/Plan:  [redacted] week gestation  2 prior cesarean deliveries  Desires repeat cesarean section  Desires sterilization  Obesity  The patient is scheduled for repeat cesarean delivery and tubal sterilization procedure. She wishes to have a large portion of fallopian tube removed if possible. She understands the indications for surgical procedure as well as the alternative treatment options. She accepts the risk of, but not limited to, anesthetic complications, bleeding, infections, possible damage to the surrounding organs, and possible tubal failure. She understands the possibility that she may have excessive bleeding and that hysterectomy may be required.   Kathy Stuart V 08/31/2012, 9:12 AM

## 2012-08-31 NOTE — Anesthesia Preprocedure Evaluation (Addendum)
Anesthesia Evaluation  Patient identified by MRN, date of birth, ID band Patient awake    Reviewed: Allergy & Precautions, H&P , NPO status , Patient's Chart, lab work & pertinent test results  Airway Mallampati: III TM Distance: >3 FB Neck ROM: Full    Dental no notable dental hx. (+) Teeth Intact and Caps   Pulmonary Recent URI , Resolved,  breath sounds clear to auscultation  Pulmonary exam normal       Cardiovascular + Peripheral Vascular Disease negative cardio ROS  Rhythm:Regular Rate:Normal     Neuro/Psych  Headaches, negative psych ROS   GI/Hepatic negative GI ROS, Neg liver ROS,   Endo/Other  Morbid obesity  Renal/GU negative Renal ROS  negative genitourinary   Musculoskeletal   Abdominal   Peds  Hematology negative hematology ROS (+)   Anesthesia Other Findings   Reproductive/Obstetrics (+) Pregnancy Previous C/Section Desires Sterilization                          Anesthesia Physical Anesthesia Plan  ASA: III  Anesthesia Plan: Spinal   Post-op Pain Management:    Induction:   Airway Management Planned: Natural Airway  Additional Equipment:   Intra-op Plan:   Post-operative Plan:   Informed Consent: I have reviewed the patients History and Physical, chart, labs and discussed the procedure including the risks, benefits and alternatives for the proposed anesthesia with the patient or authorized representative who has indicated his/her understanding and acceptance.   Dental advisory given  Plan Discussed with: CRNA, Anesthesiologist and Surgeon  Anesthesia Plan Comments:         Anesthesia Quick Evaluation

## 2012-08-31 NOTE — Transfer of Care (Signed)
Immediate Anesthesia Transfer of Care Note  Patient: Kathy Stuart  Procedure(s) Performed: Procedure(s): CESAREAN SECTION WITH BILATERAL TUBAL LIGATION (Bilateral)  Patient Location: PACU  Anesthesia Type:Spinal  Level of Consciousness: awake, alert  and oriented  Airway & Oxygen Therapy: Patient Spontanous Breathing  Post-op Assessment: Report given to PACU RN and Post -op Vital signs reviewed and stable  Post vital signs: Reviewed and stable  Complications: No apparent anesthesia complications

## 2012-08-31 NOTE — Op Note (Signed)
OPERATIVE NOTE  Patient's Name: Kathy Stuart  Date of Birth: 1975-12-09   Medical Records Number: [id number]   Date of Operation: 08/31/2012   Preoperative diagnosis:  [redacted]w[redacted]d weeks gestation  Prior Cesarean; Desire for Sterilization; [phone], [phone]  Desires Repeat Cesarean Section  Obesity  Postoperative diagnosis:  [redacted]w[redacted]d weeks gestation  Prior Cesarean; Desire for Sterilization  Desires Repeat Cesarean Section  Obesity  Adhesions  Procedure:  Repeat low transverse cesarean section  Bilateral tubal sterilization procedure  Surgeon:  Leonard Schwartz, M.D.  Assistant:  Haroldine Laws, certified nurse midwife  Anesthesia:  Spinal  Disposition:  Kathy Stuart is a 37 y.o. female, [redacted]w[redacted]d, who presents at [redacted]w[redacted]d weeks gestation. The patient has been followed at the [facility] obstetrics and gynecology division of [facility]. This pregnancy has been complicated by a prior cesarean section. The patient desires a repeat cesarean section. She understands the indications for her procedure and she accepts the risk of, but not limited to, anesthetic complications, bleeding, infections, and possible damage to the surrounding organs. She also desires tubal sterilization. She understands that there is a failure rate of approximately 10-17 women per 1000.  Findings:  A  female ([name]) was delivered from a occiput transverse position.  The Apgar scores were 7/8. The uterus, fallopian tubes, and ovaries were normal for the gravid state. Dense adhesions were noted through each of the layers of the abdomen. The patient has had 2 prior cesarean deliveries. An anterior placenta was present. The patient weighs 325 pounds.  Procedure:  The patient was taken to the operating room where a spinal anesthetic was given.The perineum was prepped with betadine. A Foley catheter was placed in the bladder.The patient's abdomen was prepped with Duraprep.   The  patient was sterilely draped. A "timeout" was performed which properly identified the patient and the correct operative procedure. The lower abdomen was injected with half percent Marcaine with epinephrine. A low transverse incision was made in the abdomen and carried sharply through the subcutaneous tissue, the fascia, and the anterior peritoneum. An incision was made in the lower uterine segment. The incision was extended in a low transverse fashion. The membranes were ruptured. The fetal head was delivered with a vacuum extractor. Several pop-off occurred. Delivery was complicated by the patient's obese abdomen and difficulty pushing the baby out. The mouth and nose were suctioned. The remainder of the infant was then delivered. The cord was clamped and cut. The infant was handed to the awaiting pediatric team. The placenta was removed. The uterine cavity was cleaned of amniotic fluid, clotted blood, and membranes. The uterine incision was closed using a running locking suture of 2-0 Vicryl. An imbricating suture of 2-0 Vicryl was placed. The pelvis was vigorously irrigated. Hemostasis was adequate. The anterior peritoneum and the abdominal musculature were closed using 2-0 Vicryl. The fascia was closed using a running suture of 0 Vicryl followed by 3 interrupted sutures of 0 Vicryl. The subcutaneous layer was closed using interrupted sutures of 2-0 Vicryl. The skin was reapproximated using a subcuticular suture of 3-0 Monocryl. Sponge, needle, and instrument counts were correct on 2 occasions. The estimated blood loss for the procedure was 1200 cc. The patient tolerated her procedure well. She was transported to the recovery room in stable condition. The infant remained in the operating room with the mother for bonding. The placenta was sent to pathology.  Leonard Schwartz, M.D.

## 2012-08-31 NOTE — Anesthesia Procedure Notes (Signed)
Spinal  Patient location during procedure: OR Start time: 08/31/2012 9:37 AM Staffing Anesthesiologist: Delos Klich A. Performed by: anesthesiologist  Preanesthetic Checklist Completed: patient identified, site marked, surgical consent, pre-op evaluation, timeout performed, IV checked, risks and benefits discussed and monitors and equipment checked Spinal Block Patient position: sitting Prep: site prepped and draped and DuraPrep Patient monitoring: heart rate, cardiac monitor, continuous pulse ox and blood pressure Approach: midline Location: L3-4 Injection technique: single-shot Needle Needle type: Tuohy and Spinocan  Needle gauge: 24 G Needle length: 12.7 cm Needle insertion depth: 9 cm Assessment Sensory level: T4 Additional Notes Patient tolerated procedure well. Attempt x 3. Used Touhy to ID epidural space and then performed Spinal through epidural needle. Adequate sensory level.

## 2012-08-31 NOTE — Anesthesia Postprocedure Evaluation (Signed)
Anesthesia Post Note  Patient: Kathy Stuart  Procedure(s) Performed: Procedure(s) (LRB): CESAREAN SECTION WITH BILATERAL TUBAL LIGATION (Bilateral)  Anesthesia type: Spinal  Patient location: PACU  Post pain: Pain level controlled  Post assessment: Post-op Vital signs reviewed  Last Vitals:  Filed Vitals:   08/31/12 1145  BP: 130/63  Pulse: 89  Temp:   Resp: 16    Post vital signs: Reviewed  Level of consciousness: awake  Complications: No apparent anesthesia complications

## 2012-08-31 NOTE — Progress Notes (Signed)
Patient ID: MANASVINI WHATLEY, female   DOB: January 23, 1976, 37 y.o.   MRN: [id number]  SUBJECTIVE: I was called to evaluate the patient who had been diagnosed with a stage I postpartum hemorrhage. By her postpartum nurse, who described a large amount of bleeding on a towel shortly after her fundus check. The patient was awake and alert, with no complaints of dizziness. Her vital signs were stable per the nurse and she had adequate urine output as noted in her Foley catheter. The patient volunteered that she has a history of chronic anemia. Because of heavy periods from fibroidS.    OBJECTIVE: BP 111/79  Pulse 70  Temp(Src) 97.3 F (36.3 C) (Oral)  Resp 18  Wt 335 lb (151.955 kg)  BMI 50.95 kg/m2  SpO2 96%  LMP 12/02/2011  The patient is a morbidly, obese, black female in no acute distress, lying flat in her bed with a minimal amount of blood on the new lead changed pad. The uterine fundus felt firm to palpation, but was clearly enlarged by large fibroids, reaching to just above the umbilicus. The patient's large panniculus made further evaluation of the uterus somewhat difficult however, no significant amount of bleeding was obtained. At the time of fundal massage. A digital vaginal examination did reveal several blood clots , which were removed  Preoperative hemoglobin was 12.4 The patient has 2 units of packed red blood cells ready in the blood bank.  ASSESSMENT:  Postpartum hemorrhage, level I inpatient at risk for postpartum hemorrhage. Because of the following features: #1)  2 prior cesarean sections with her third cesarean sectioN today #2)  uterine fibroids #3)  morbid obesity  History of anemia with a pre-partum hemoglobin of 12.4  RECOMMENDATION:  The patient was given Methergine 0.2 mg IM and a Methergine series of Methergine 0.2 mg by mouth every 4 hours for 8 doses. Has been ordered The patient is given Cytotec 1000 mcg per rectum I have explained to the patient that she may,  indeed require blood transfusion if her postpartum hemorrhage, becomes more severe. I have reviewed the postpartum hemorrhage, protocol with the nurse caring for the patient and the postpartum hemorrhage, cart remains in her room.

## 2012-08-31 NOTE — Progress Notes (Signed)
Subjective: Postpartum Day 0: Scheduled Repeat Cesarean Delivery RN called to notify of heavy bleeding and clots.  RN was called to room after pt experienced a large gush. RN reported pt saturated blue pad and towel.  Fundus firm (with large fibroids).  VSS.  Objective: Vital signs in last 24 hours: Temp:  [97 F (36.1 C)-97.9 F (36.6 C)] 97.5 F (36.4 C) (04/10 1459) Pulse Rate:  [65-91] 68 (04/10 1459) Resp:  [16-20] 16 (04/10 1459) BP: (120-151)/(49-85) 142/85 mmHg (04/10 1459) SpO2:  [93 %-100 %] 94 % (04/10 1459) Weight:  [335 lb (151.955 kg)] 335 lb (151.955 kg) (04/10 1320)  Physical Exam:  General: alert and fatigued Lochia: Small trickle noted, no clots Uterine Fundus: firm  The estimated blood loss during c/s was 1200 cc Overall blood loss in MB estimated 300-400 cc   Recent Labs  08/29/12 0940  HGB 12.4  HCT 38.3    Assessment/Plan: Status post Cesarean section day 0. Postoperative course complicated by [facility]   Methergine 0.2 mg IM Once Now   followed by Methergine 0.2 mg PO q4 for 8 doses Continue to monitor bleeding Will consider Cytotec if bleeding continues CBC already scheduled in am  Keriann Rankin 08/31/2012, 5:28 PM

## 2012-09-01 ENCOUNTER — Encounter (HOSPITAL_COMMUNITY): Payer: Self-pay | Admitting: Obstetrics and Gynecology

## 2012-09-01 LAB — CBC
HCT: 31.4 % — ABNORMAL LOW (ref 36.0–46.0)
Hemoglobin: 10.2 g/dL — ABNORMAL LOW (ref 12.0–15.0)
MCV: 85.1 fL (ref 78.0–100.0)
RBC: 3.69 MIL/uL — ABNORMAL LOW (ref 3.87–5.11)
WBC: 10.4 10*3/uL (ref 4.0–10.5)

## 2012-09-01 MED ORDER — MEDROXYPROGESTERONE ACETATE 150 MG/ML IM SUSP: 150.0000 mg | INTRAMUSCULAR | Status: DC | PRN

## 2012-09-01 NOTE — Anesthesia Postprocedure Evaluation (Signed)
Anesthesia Post Note  Patient: Kathy Stuart  Procedure(s) Performed: Procedure(s) (LRB): CESAREAN SECTION WITH BILATERAL TUBAL LIGATION (Bilateral)  Anesthesia type: SAB  Patient location: Mother/Baby  Post pain: Pain level controlled  Post assessment: Post-op Vital signs reviewed  Last Vitals:  Filed Vitals:   09/01/12 0530  BP: 121/77  Pulse: 84  Temp: 36.8 C  Resp: 20    Post vital signs: Reviewed  Level of consciousness: awake  Complications: No apparent anesthesia complications

## 2012-09-01 NOTE — Plan of Care (Signed)
Problem: Discharge Progression Outcomes Goal: Barriers To Progression Addressed/Resolved Outcome: Progressing Pt had PP hemorrhage 4/10 and is being monitored for hemaglobin levels and further bleeding and clots.

## 2012-09-02 LAB — TYPE AND SCREEN
ABO/RH(D): O POS
Unit division: 0

## 2012-09-02 LAB — CBC
MCH: 27.4 pg (ref 26.0–34.0)
MCV: 85.2 fL (ref 78.0–100.0)
Platelets: 173 10*3/uL (ref 150–400)
RBC: 3.58 MIL/uL — ABNORMAL LOW (ref 3.87–5.11)
RDW: 16.9 % — ABNORMAL HIGH (ref 11.5–15.5)

## 2012-09-02 NOTE — Progress Notes (Signed)
Subjective: Postpartum Day 2: Cesarean Delivery due to repeat, with tubal sterilization--fibroid uterus Patient up ad lib, reports no syncope or dizziness.  Notes bleeding has been minimal today.  Has had several spontaneous voids.  Pain well-controlled with po meds. Feeding:  Breast Contraceptive plan:  Tubal sterilization with C/S   Objective: Vital signs in last 24 hours: Temp:  [97.8 F (36.6 C)-98.1 F (36.7 C)] 97.8 F (36.6 C) (04/12 0610) Pulse Rate:  [86-99] 90 (04/12 0610) Resp:  [18] 18 (04/12 0610) BP: (104-139)/(68-88) 139/88 mmHg (04/12 0610)  Physical Exam:  General: alert Lochia: appropriate Uterine Fundus: firm, 1 above umbilicus (reduced in size from yesterday), no clots expressed. Incision: healing well, pressure dressing CDI DVT Evaluation: No evidence of DVT seen on physical exam. Negative Homan's sign. JP drain:   50 cc last 24 hours, serosanguinous drainage.   Recent Labs  09/01/12 0635 09/02/12 0715  HGB 10.2* 9.8*  HCT 31.4* 30.5*  Orthostatics stable.  Assessment/Plan: Status post Cesarean section day 2, with tubal sterilization PPH Class 1--stable hemodynamically Fibroid uterus. Doing well postoperatively.  Continue current care. May shower and remove pressure dressing. D/C saline lock. Plan for discharge tomorrow    Nigel Bridgeman 09/02/2012, 10:23 AM

## 2012-09-02 NOTE — Progress Notes (Signed)
Subjective: Postpartum Day 2: Cesarean Delivery due to repeat, with tubal sterilization--fibroid uterus Patient up ad lib, reports no syncope or dizziness.  Notes bleeding has been minimal today.  Has had several spontaneous voids.  Pain well-controlled with po meds. Feeding:  Breast Contraceptive plan:  Tubal sterilization with C/S  Objective: Vital signs in last 24 hours: Temp:  [97.8 F (36.6 C)-98.6 F (37 C)] 97.8 F (36.6 C) (04/12 0610) Pulse Rate:  [86-99] 90 (04/12 0610) Resp:  [18] 18 (04/12 0610) BP: (104-139)/(67-88) 139/88 mmHg (04/12 0610) SpO2:  [97 %] 97 % (04/11 1020)  Physical Exam:  General: alert Lochia: appropriate Uterine Fundus: firm, 1 above umbilicus (reduced in size from yesterday), no clots expressed. Incision: healing well, pressure dressing CDI DVT Evaluation: No evidence of DVT seen on physical exam. Negative Homan's sign. JP drain:   50 cc last 24 hours, serosanguinous drainage.   Recent Labs  09/01/12 0635 09/02/12 0715  HGB 10.2* 9.8*  HCT 31.4* 30.5*  Orthostatics stable.  Assessment/Plan: Status post Cesarean section day 2, with tubal sterilization PPH Class 1--stable hemodynamically Fibroid uterus. Doing well postoperatively.  Continue current care. May shower and remove pressure dressing. D/C saline lock. Plan for discharge tomorrow    Nigel Bridgeman 09/02/2012, 10:17 AM

## 2012-09-03 MED ORDER — FERROUS SULFATE 325 (65 FE) MG PO TABS: 325.0000 mg | ORAL_TABLET | Freq: Two times a day (BID) | ORAL | Status: AC

## 2012-09-03 MED ORDER — OXYCODONE-ACETAMINOPHEN 5-325 MG PO TABS: 1.0000 | ORAL_TABLET | ORAL | Status: DC | PRN

## 2012-09-03 MED ORDER — IBUPROFEN 800 MG PO TABS: 800.0000 mg | ORAL_TABLET | Freq: Three times a day (TID) | ORAL | Status: DC | PRN

## 2012-09-03 NOTE — Discharge Summary (Signed)
Cesarean Section Delivery Discharge Summary  Kathy Stuart  DOB:    1975-12-14 MRN:    [id number] CSN:    [id number]  Date of admission:                  08/31/2012  Date of discharge:                   09/03/2012  Procedures this admission:  Date of Delivery: 08/31/2012 repeat low transverse cesarean section and bilateral tubal sterilization procedure by Dr. Stefano Gaul  Newborn Data:  Live born female  Birth Weight: 8 lb 5.2 oz (3775 g) APGAR: 7, 8  Home with mother. Name: Kathy Stuart  History of Present Illness:  Ms. Kathy Stuart is a 37 y.o. female, [id number], who presents at [redacted]w[redacted]d weeks gestation. The patient has been followed at the [facility] of [facility].    Her pregnancy has been complicated by:  Patient Active Problem List  Diagnosis  . H/O menorrhagia  . Vitamin D deficiency  . UTI (urinary tract infection) in pregnancy, antepartum  . Hx of cesarean section  . History of twin pregnancy in prior pregnancy  . AMA (advanced maternal age) multigravida 35+  . BMI 40.0-44.9, adult  . LGA (large for gestational age) fetus  . S/P repeat low transverse C-section    Hospital course:  The patient was admitted for a repeat cesarean delivery. Her surgery was complicated by adhesions from her prior two cesarean deliveries. Her weight is greater than 300 pounds and that made her surgery difficult. The blood loss during her surgery was 1200 cc.   Her postpartum course was complicated. She had a postpartum hemorrhage requiring Methergine and Cytotec. Her postpartum hemoglobin was stable. She was discharged to home on postpartum day 3 doing well. A JP drain was left in place because she had 5 cc over 3 hours.  Feeding:  breast  Contraception:  bilateral tubal ligation  Discharge hemoglobin:  Hemoglobin  Date Value Range Status  09/02/2012 9.8* 12.0 - 15.0 g/dL Final     HCT  Date Value Range Status  09/02/2012  30.5* 36.0 - 46.0 % Final    Discharge Physical Exam:   General: no distress and morbidly obese Lochia: appropriate Uterine Fundus: firm Incision: healing well DVT Evaluation: No evidence of DVT seen on physical exam.  Intrapartum Procedures: cesarean: low cervical, transverse and tubal ligation Postpartum Procedures: Postpartum hemorrhage protocol Complications-Operative and Postpartum: Postpartum hemorrhage  Discharge Diagnoses: Term Pregnancy-delivered and Advanced maternal age, obesity (weight greater than 300 pounds), 2 prior cesarean sections, desires repeat cesarean section, fibroid uterus, pelvic adhesions, postpartum hemorrhage, anemia, and continued drainage from her JP drain.  Discharge Information:  Activity:           pelvic rest Diet:                routine Medications: PNV, Ibuprofen, Colace, Iron and Percocet Condition:      stable and improved Instructions:  Care after cesarean section and postpartum depression Discharge to: home  Follow-up Information   Follow up with [facility] In 1 week. (Removed JP drain)    Contact information:   [address] [place] [address]        Janine Limbo 09/03/2012

## 2012-11-01 ENCOUNTER — Ambulatory Visit: Payer: BC Managed Care – PPO | Attending: Obstetrics and Gynecology | Admitting: Physical Therapy

## 2012-11-01 DIAGNOSIS — M2550 Pain in unspecified joint: Secondary | ICD-10-CM | POA: Insufficient documentation

## 2012-11-01 DIAGNOSIS — M25659 Stiffness of unspecified hip, not elsewhere classified: Secondary | ICD-10-CM | POA: Insufficient documentation

## 2012-11-01 DIAGNOSIS — IMO0001 Reserved for inherently not codable concepts without codable children: Secondary | ICD-10-CM | POA: Insufficient documentation

## 2012-11-14 ENCOUNTER — Ambulatory Visit: Payer: BC Managed Care – PPO | Admitting: Physical Therapy

## 2012-11-17 ENCOUNTER — Ambulatory Visit: Payer: BC Managed Care – PPO | Admitting: Physical Therapy

## 2012-11-21 ENCOUNTER — Other Ambulatory Visit: Payer: Self-pay | Admitting: Obstetrics and Gynecology

## 2012-11-27 ENCOUNTER — Ambulatory Visit: Payer: BC Managed Care – PPO | Attending: Physician Assistant | Admitting: Physical Therapy

## 2012-11-27 DIAGNOSIS — M25659 Stiffness of unspecified hip, not elsewhere classified: Secondary | ICD-10-CM | POA: Insufficient documentation

## 2012-11-27 DIAGNOSIS — R209 Unspecified disturbances of skin sensation: Secondary | ICD-10-CM | POA: Insufficient documentation

## 2012-11-27 DIAGNOSIS — IMO0001 Reserved for inherently not codable concepts without codable children: Secondary | ICD-10-CM | POA: Insufficient documentation

## 2012-11-27 DIAGNOSIS — M2550 Pain in unspecified joint: Secondary | ICD-10-CM | POA: Insufficient documentation

## 2012-11-29 ENCOUNTER — Ambulatory Visit: Payer: BC Managed Care – PPO | Admitting: Physical Therapy

## 2012-12-04 ENCOUNTER — Ambulatory Visit: Payer: BC Managed Care – PPO | Admitting: Physical Therapy

## 2012-12-06 ENCOUNTER — Ambulatory Visit: Payer: BC Managed Care – PPO | Admitting: Physical Therapy

## 2012-12-11 ENCOUNTER — Ambulatory Visit: Payer: BC Managed Care – PPO

## 2012-12-13 ENCOUNTER — Other Ambulatory Visit: Payer: Self-pay | Admitting: Obstetrics and Gynecology

## 2012-12-18 ENCOUNTER — Encounter (HOSPITAL_COMMUNITY): Payer: Self-pay | Admitting: *Deleted

## 2012-12-19 ENCOUNTER — Other Ambulatory Visit (HOSPITAL_COMMUNITY): Payer: Self-pay | Admitting: Obstetrics and Gynecology

## 2012-12-19 NOTE — H&P (Signed)
Kathy Stuart is a 36 y.o.  female P3-0-0-4 presents for hysteroscopy, dilatation, curettage and endometrial ablation because of dysfunctional uterine bleeding.  Since age 13 the patient has had very heavy periods however,  after the birth of her twins in 2008 her flow became heavier. Over the years she's tried oral contraceptives and evening primrose oil with only minimal decrease in her flow volume.  Her menses lasts for six days with pad and tampon change 1-2 times an hour and clothing on a regular basis due to frequent soiling by gushes of blood.  Her cramping is rated as a 5/10 on a 10 point pain scale and as only had one episode of inter-menstrual bleeding over the years.  She denies dyspareunia, changes in bowel habits of urinary tract symptoms. An endometrial biopsy performed in July 2014 was benign showing no atypia, malignancy or hyperplasia.  A pelvic ultrasound done at the same time showed a uterus-8.30 x 6.68 x 5.59 cm, endometrium-0.672 cm; right ovary-3.20 x 2.37 x 1.98 cm and left ovary-4.14 x 2.52 x 2.30 cm.  In recent months, the patient has enjoyed some relief from her bleeding by using Lysteda however, she desires a more permanent  solution and has therefore chosen to pursue endometrial ablation.   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  Remote  history of abnormal PAP smear in 1994, normal since;  Last PAP smear: May 2014  Medical History: Thyromegaly, anemia, migraines, herniated lumbar discMedications:  Surgical History:  2014  Tubal Sterilization Denies problems with anesthesia or history of blood transfusions  Family History:  Hypertension, COPD, Bipolar Disorder,  Alcoholism, Stroke,  Heart Disease, Depression,  Thyroid Disease and Arthritis  Social History:  Married and Works at Home;   Former smoker, rare alcohol intake and denies illicit drugs  Medications:   Calcium-D daily Ferrous Sulfate 325 mg daily Zyrtec prn  No Known Allergies  Denies sensitivity to peanuts, shellfish, soy, latex or adhesives.  ROS: Admits to glasses, low back pain, SI joint dysfunction and constipation; Denies headache, vision changes, nasal congestion, dysphagia, tinnitus, dizziness, hoarseness, cough,  chest pain, shortness of breath, nausea, vomiting, diarrhea, urinary frequency, urgency  dysuria, hematuria, vaginitis symptoms, pelvic pain, swelling of joints,easy bruising,  myalgias, skin rashes, unexplained weight loss and except as is mentioned in the history of present illness, patient's review of systems is otherwise negative.  Physical Exam  Bp: 188/74   P: 72   R: 12    Temp.: 98.5 degrees F orally    Weight: 317 lbs   Height: 5'8"    BMI: 48.2  Neck: supple without masses or thyromegaly Lungs: clear to auscultation Heart: regular rate and rhythm Abdomen: soft, non-tender and no organomegaly Pelvic:EGBUS- wnl; vagina-normal rugae; uterus-normal size, cervix without lesions or motion tenderness; adnexae-no tenderness or masses Extremities:  no clubbing, cyanosis or edema   Assesment:  Dysfunctional Uterine Bleeding   Disposition:  A discussion was held with patient regarding the indication for her procedure(s) along with the risks, which include but are not limited to: reaction to anesthesia, damage to adjacent organs, infection and excessive bleeding. The patient verbalized understanding of these risks and has consented to proceed with Hysteroscopy, Dilatation, Curettage and Endometrial Ablation at [facility], December 21, 2012 at 9:15 a.m.   CSN# [id number]   Anntoinette Haefele J. Alayshia Marini, PA-C  for Dr. Angela Y. Roberts    

## 2012-12-20 ENCOUNTER — Ambulatory Visit: Payer: BC Managed Care – PPO | Admitting: Physical Therapy

## 2012-12-20 ENCOUNTER — Encounter (HOSPITAL_COMMUNITY): Payer: Self-pay | Admitting: Pharmacy Technician

## 2012-12-21 ENCOUNTER — Ambulatory Visit (HOSPITAL_COMMUNITY): Payer: BC Managed Care – PPO | Admitting: Anesthesiology

## 2012-12-21 ENCOUNTER — Encounter (HOSPITAL_COMMUNITY): Payer: Self-pay | Admitting: Anesthesiology

## 2012-12-21 ENCOUNTER — Encounter (HOSPITAL_COMMUNITY)
Admission: RE | Disposition: A | Discharge status: Home / Self Care | Payer: Self-pay | Source: Ambulatory Visit | Attending: Obstetrics and Gynecology

## 2012-12-21 ENCOUNTER — Ambulatory Visit (HOSPITAL_COMMUNITY)
Admission: RE | Admit: 2012-12-21 | Discharge: 2012-12-21 | Disposition: A | Discharge status: Home / Self Care | Payer: BC Managed Care – PPO | Source: Ambulatory Visit | Attending: Obstetrics and Gynecology | Admitting: Obstetrics and Gynecology

## 2012-12-21 DIAGNOSIS — N938 Other specified abnormal uterine and vaginal bleeding: Secondary | ICD-10-CM | POA: Insufficient documentation

## 2012-12-21 DIAGNOSIS — N920 Excessive and frequent menstruation with regular cycle: Secondary | ICD-10-CM | POA: Insufficient documentation

## 2012-12-21 DIAGNOSIS — N949 Unspecified condition associated with female genital organs and menstrual cycle: Secondary | ICD-10-CM | POA: Insufficient documentation

## 2012-12-21 HISTORY — PX: HYSTEROSCOPY W/D&C: SHX1775

## 2012-12-21 LAB — HCG, SERUM, QUALITATIVE: Preg, Serum: NEGATIVE

## 2012-12-21 LAB — CBC
HCT: 39.5 % (ref 36.0–46.0)
Hemoglobin: 12.7 g/dL (ref 12.0–15.0)
MCH: 26.5 pg (ref 26.0–34.0)
MCHC: 32.8 g/dL (ref 30.0–36.0)
MCHC: 32.2 g/dL (ref 30.0–36.0)
Platelets: 242 10*3/uL (ref 150–400)
RDW: 15.7 % — ABNORMAL HIGH (ref 11.5–15.5)

## 2012-12-21 LAB — BASIC METABOLIC PANEL
BUN: 9 mg/dL (ref 6–23)
Calcium: 9.5 mg/dL (ref 8.4–10.5)
GFR calc Af Amer: >90 mL/min (ref >90)
GFR calc non Af Amer: >90 mL/min (ref >90)
Glucose, Bld: 94 mg/dL (ref 70–99)
Potassium: 4 mEq/L (ref 3.5–5.1)

## 2012-12-21 SURGERY — DILATATION AND CURETTAGE /HYSTEROSCOPY
Anesthesia: General | Wound class: Clean Contaminated

## 2012-12-21 MED ORDER — MIDAZOLAM HCL 5 MG/5ML IJ SOLN
INTRAMUSCULAR | Status: DC | PRN
  Administered 2012-12-21: 2 mg via INTRAVENOUS

## 2012-12-21 MED ORDER — LIDOCAINE HCL (CARDIAC) 20 MG/ML IV SOLN
INTRAVENOUS | Status: DC | PRN
  Administered 2012-12-21: 50 mg via INTRAVENOUS

## 2012-12-21 MED ORDER — METOCLOPRAMIDE HCL 5 MG/ML IJ SOLN: 10.0000 mg | Freq: Once | INTRAMUSCULAR | Status: DC | PRN

## 2012-12-21 MED ORDER — LACTATED RINGERS IV SOLN
INTRAVENOUS | Status: DC
  Administered 2012-12-21: 10:00:00 via INTRAVENOUS

## 2012-12-21 MED ORDER — ONDANSETRON HCL 4 MG/2ML IJ SOLN
INTRAMUSCULAR | Status: AC
  Filled 2012-12-21: qty 2

## 2012-12-21 MED ORDER — LIDOCAINE HCL 1 % IJ SOLN
INTRAMUSCULAR | Status: DC | PRN
  Administered 2012-12-21: 10 mL

## 2012-12-21 MED ORDER — IBUPROFEN 600 MG PO TABS: 600.0000 mg | ORAL_TABLET | Freq: Four times a day (QID) | ORAL | Status: DC | PRN

## 2012-12-21 MED ORDER — PROPOFOL 10 MG/ML IV EMUL
INTRAVENOUS | Status: AC
  Filled 2012-12-21: qty 20

## 2012-12-21 MED ORDER — MEPERIDINE HCL 25 MG/ML IJ SOLN: 6.2500 mg | INTRAMUSCULAR | Status: DC | PRN

## 2012-12-21 MED ORDER — DEXAMETHASONE SODIUM PHOSPHATE 10 MG/ML IJ SOLN
INTRAMUSCULAR | Status: AC
  Filled 2012-12-21: qty 1

## 2012-12-21 MED ORDER — LIDOCAINE HCL (CARDIAC) 20 MG/ML IV SOLN
INTRAVENOUS | Status: AC
  Filled 2012-12-21: qty 5

## 2012-12-21 MED ORDER — HYDROCODONE-ACETAMINOPHEN 5-300 MG PO TABS: 1.0000 | ORAL_TABLET | Freq: Four times a day (QID) | ORAL | Status: DC | PRN

## 2012-12-21 MED ORDER — FENTANYL CITRATE 0.05 MG/ML IJ SOLN
INTRAMUSCULAR | Status: DC | PRN
  Administered 2012-12-21: 100 ug via INTRAVENOUS

## 2012-12-21 MED ORDER — FENTANYL CITRATE 0.05 MG/ML IJ SOLN: 25.0000 ug | INTRAMUSCULAR | Status: DC | PRN

## 2012-12-21 MED ORDER — FENTANYL CITRATE 0.05 MG/ML IJ SOLN
INTRAMUSCULAR | Status: AC
  Filled 2012-12-21: qty 2

## 2012-12-21 MED ORDER — PROPOFOL 10 MG/ML IV BOLUS
INTRAVENOUS | Status: DC | PRN
  Administered 2012-12-21: 200 mg via INTRAVENOUS

## 2012-12-21 MED ORDER — MIDAZOLAM HCL 2 MG/2ML IJ SOLN
INTRAMUSCULAR | Status: AC
  Filled 2012-12-21: qty 2

## 2012-12-21 MED ORDER — DEXAMETHASONE SODIUM PHOSPHATE 10 MG/ML IJ SOLN
INTRAMUSCULAR | Status: DC | PRN
  Administered 2012-12-21: 10 mg via INTRAVENOUS

## 2012-12-21 MED ORDER — KETOROLAC TROMETHAMINE 30 MG/ML IJ SOLN
INTRAMUSCULAR | Status: AC
  Filled 2012-12-21: qty 1

## 2012-12-21 MED ORDER — ONDANSETRON HCL 4 MG/2ML IJ SOLN
INTRAMUSCULAR | Status: DC | PRN
  Administered 2012-12-21: 4 mg via INTRAVENOUS

## 2012-12-21 SURGICAL SUPPLY — 14 items
ABLATOR ENDOMETRIAL BIPOLAR (ABLATOR) ×2 IMPLANT
CATH ROBINSON RED A/P 16FR (CATHETERS) ×2 IMPLANT
CLOTH BEACON ORANGE TIMEOUT ST (SAFETY) ×2 IMPLANT
CONTAINER PREFILL 10% NBF 60ML (FORM) ×4 IMPLANT
DRESSING TELFA 8X3 (GAUZE/BANDAGES/DRESSINGS) ×2 IMPLANT
GLOVE BIO SURGEON STRL SZ7.5 (GLOVE) ×2 IMPLANT
GLOVE BIOGEL PI IND STRL 7.5 (GLOVE) ×1 IMPLANT
GLOVE BIOGEL PI INDICATOR 7.5 (GLOVE) ×1
GOWN STRL REIN XL XLG (GOWN DISPOSABLE) ×6 IMPLANT
PACK HYSTEROSCOPY LF (CUSTOM PROCEDURE TRAY) ×2 IMPLANT
PAD OB MATERNITY 4.3X12.25 (PERSONAL CARE ITEMS) ×2 IMPLANT
PAD PREP 24X48 CUFFED NSTRL (MISCELLANEOUS) ×2 IMPLANT
TOWEL OR 17X24 6PK STRL BLUE (TOWEL DISPOSABLE) ×4 IMPLANT
WATER STERILE IRR 1000ML POUR (IV SOLUTION) ×2 IMPLANT

## 2012-12-21 NOTE — Anesthesia Postprocedure Evaluation (Signed)
  Anesthesia Post-op Note  Patient: Kathy Stuart  Procedure(s) Performed: Procedure(s) with comments: DILATATION AND CURETTAGE /HYSTEROSCOPY (N/A) - with Attempted Novasure Ablation Patient is awake and responsive. Pain and nausea are reasonably well controlled. Vital signs are stable and clinically acceptable. Oxygen saturation is clinically acceptable. There are no apparent anesthetic complications at this time. Patient is ready for discharge.

## 2012-12-21 NOTE — Transfer of Care (Signed)
Immediate Anesthesia Transfer of Care Note  Patient: Kathy Stuart  Procedure(s) Performed: Procedure(s) with comments: DILATATION AND CURETTAGE /HYSTEROSCOPY (N/A) - with Attempted Novasure Ablation  Patient Location: PACU  Anesthesia Type:General  Level of Consciousness: awake, alert , oriented and patient cooperative  Airway & Oxygen Therapy: Patient Spontanous Breathing and Patient connected to nasal cannula oxygen  Post-op Assessment: Report given to PACU RN and Post -op Vital signs reviewed and stable  Post vital signs: Reviewed and stable  Complications: No apparent anesthesia complications

## 2012-12-21 NOTE — Interval H&P Note (Signed)
History and Physical Interval Note:  12/21/2012 11:20 AM  Kathy Stuart  has presented today for surgery, with the diagnosis of Menorrhagia and DUB.  The various methods of treatment have been discussed with the patient and family. After consideration of risks, benefits and other options for treatment, the patient has consented to  Procedure(s) with comments: DILATATION & CURETTAGE/HYSTEROSCOPY WITH NOVASURE ABLATION (N/A) - Hysteroscopy D&C and Ablation as a surgical intervention .  The patient's history has been reviewed, patient examined, no change in status, stable for surgery.  I have reviewed the patient's chart and labs.  Questions were answered to the patient's satisfaction.     Purcell Nails

## 2012-12-21 NOTE — H&P (View-Only) (Signed)
Kathy Stuart is a 37 y.o.  female P3-0-0-4 presents for hysteroscopy, dilatation, curettage and endometrial ablation because of dysfunctional uterine bleeding.  Since age 75 the patient has had very heavy periods however,  after the birth of her twins in 2008 her flow became heavier. Over the years she's tried oral contraceptives and evening primrose oil with only minimal decrease in her flow volume.  Her menses lasts for six days with pad and tampon change 1-2 times an hour and clothing on a regular basis due to frequent soiling by gushes of blood.  Her cramping is rated as a 5/10 on a 10 point pain scale and as only had one episode of inter-menstrual bleeding over the years.  She denies dyspareunia, changes in bowel habits of urinary tract symptoms. An endometrial biopsy performed in July 2014 was benign showing no atypia, malignancy or hyperplasia.  A pelvic ultrasound done at the same time showed a uterus-8.30 x 6.68 x 5.59 cm, endometrium-0.672 cm; right ovary-3.20 x 2.37 x 1.98 cm and left ovary-4.14 x 2.52 x 2.30 cm.  In recent months, the patient has enjoyed some relief from her bleeding by using Lysteda however, she desires a more permanent  solution and has therefore chosen to pursue endometrial ablation.   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  Remote  history of abnormal PAP smear in 1994, normal since;  Last PAP smear: May 2014  Medical History: Thyromegaly, anemia, migraines, herniated lumbar discMedications:  Surgical History:  2014  Tubal Sterilization Denies problems with anesthesia or history of blood transfusions  Family History:  Hypertension, COPD, Bipolar Disorder,  Alcoholism, Stroke,  Heart Disease, Depression,  Thyroid Disease and Arthritis  Social History:  Married and Works at [employer];   Former smoker, rare alcohol intake and denies illicit drugs  Medications:   Calcium-D daily Ferrous Sulfate 325 mg daily Zyrtec prn  No Known Allergies  Denies sensitivity to peanuts, shellfish, soy, latex or adhesives.  ROS: Admits to glasses, low back pain, SI joint dysfunction and constipation; Denies headache, vision changes, nasal congestion, dysphagia, tinnitus, dizziness, hoarseness, cough,  chest pain, shortness of breath, nausea, vomiting, diarrhea, urinary frequency, urgency  dysuria, hematuria, vaginitis symptoms, pelvic pain, swelling of joints,easy bruising,  myalgias, skin rashes, unexplained weight loss and except as is mentioned in the history of present illness, patient's review of systems is otherwise negative.  Physical Exam  Bp: 188/74   P: 72   R: 12    Temp.: 98.5 degrees F orally    Weight: 317 lbs   Height: 5'8"    BMI: 48.2  Neck: supple without masses or thyromegaly Lungs: clear to auscultation Heart: regular rate and rhythm Abdomen: soft, non-tender and no organomegaly Pelvic:EGBUS- wnl; vagina-normal rugae; uterus-normal size, cervix without lesions or motion tenderness; adnexae-no tenderness or masses Extremities:  no clubbing, cyanosis or edema   Assesment:  Dysfunctional Uterine Bleeding   Disposition:  A discussion was held with patient regarding the indication for her procedure(s) along with the risks, which include but are not limited to: reaction to anesthesia, damage to adjacent organs, infection and excessive bleeding. The patient verbalized understanding of these risks and has consented to proceed with Hysteroscopy, Dilatation, Curettage and Endometrial Ablation at [facility], December 21, 2012 at 9:15 a.m.   CSN# [id number]   Tacoma Merida J. Lowell Guitar, PA-C  for Dr. Woodroe Mode. Su Hilt

## 2012-12-21 NOTE — Op Note (Addendum)
Preop Diagnosis: Menorrhagia and DUB  Postop Diagnosis: Menorrhagia, DUB and Polyps  Procedure: DILATATION & CURETTAGE/HYSTEROSCOPY  Anesthesia: General   Attending: Purcell Nails, MD   Assistant: N/a  Findings: No obvious intracavitary lesions with polypoid appearing tissue posteriorly  Pathology: Endometrial Curettings with what appeared to be a couple of polyps not clearly visualized at the time of hysteroscopy  Fluids: See flowsheet  UOP: See flowsheet  EBL: Minimal  Complications: Perforation in posterior lower uterine segment which appeared to be at the level of the internal os  Procedure: The patient was taken to the operating room after the risks, benefits and alternatives were discussed with the patient. The patient verbalized understanding and consent signed and witnessed. The patient was placed under general anesthesia with an LMA per anesthesiologist and prepped and draped in the normal sterile fashion.  Time Out was performed per protocol.  A bivalve speculum was placed in the patient's vagina and the anterior lip of the cervix was grasped with a single tooth tenaculum. A paracervical block was administered using a total of 10 cc of 1% lidocaine. The uterus sounded to 11.5 cm. The cervix was dilated for passage of the hysteroscope.  The hysteroscope was introduced into the uterine cavity and findings as noted above. Sharp curettage was performed until a gritty texture was noted and curettings were sent to pathology. The novasure instrument was introduced into the uterine cavity without difficulty.  Cavity assessment failed.  The hysteroscope was reintroduced and uterine perforation as noted above with no active bleeding from the site noted (will check CBC in recovery room).  All instruments were removed.  Several stitches were placed at the tenaculum sites in order to obtain hemostasis.  Sponge lap and needle count was correct. The patient tolerated the procedure well  and was returned to the recovery room in good condition.

## 2012-12-21 NOTE — Anesthesia Preprocedure Evaluation (Signed)
Anesthesia Evaluation  Patient identified by MRN, date of birth, ID band Patient awake    Reviewed: Allergy & Precautions, H&P , NPO status , Patient's Chart, lab work & pertinent test results  Airway Mallampati: III TM Distance: >3 FB Neck ROM: Full    Dental no notable dental hx. (+) Teeth Intact   Pulmonary Recent URI , Resolved,  breath sounds clear to auscultation  Pulmonary exam normal       Cardiovascular + Peripheral Vascular Disease negative cardio ROS  Rhythm:Regular Rate:Normal     Neuro/Psych  Headaches, negative psych ROS   GI/Hepatic negative GI ROS, Neg liver ROS,   Endo/Other  Morbid obesity  Renal/GU negative Renal ROS  negative genitourinary   Musculoskeletal negative musculoskeletal ROS (+)   Abdominal (+) + obese,   Peds  Hematology negative hematology ROS (+)   Anesthesia Other Findings   Reproductive/Obstetrics Menorrhagia                           Anesthesia Physical Anesthesia Plan  ASA: III  Anesthesia Plan: General   Post-op Pain Management:    Induction: Intravenous  Airway Management Planned: LMA  Additional Equipment:   Intra-op Plan:   Post-operative Plan: Extubation in OR  Informed Consent: I have reviewed the patients History and Physical, chart, labs and discussed the procedure including the risks, benefits and alternatives for the proposed anesthesia with the patient or authorized representative who has indicated his/her understanding and acceptance.   Dental advisory given  Plan Discussed with: Anesthesiologist, CRNA and Surgeon  Anesthesia Plan Comments:         Anesthesia Quick Evaluation

## 2012-12-22 ENCOUNTER — Encounter (HOSPITAL_COMMUNITY): Payer: Self-pay | Admitting: Obstetrics and Gynecology

## 2012-12-26 ENCOUNTER — Ambulatory Visit: Payer: BC Managed Care – PPO | Attending: Obstetrics and Gynecology | Admitting: Physical Therapy

## 2012-12-26 DIAGNOSIS — M25659 Stiffness of unspecified hip, not elsewhere classified: Secondary | ICD-10-CM | POA: Insufficient documentation

## 2012-12-26 DIAGNOSIS — M2550 Pain in unspecified joint: Secondary | ICD-10-CM | POA: Insufficient documentation

## 2012-12-26 DIAGNOSIS — IMO0001 Reserved for inherently not codable concepts without codable children: Secondary | ICD-10-CM | POA: Diagnosis present

## 2013-01-01 ENCOUNTER — Ambulatory Visit: Payer: BC Managed Care – PPO | Admitting: Physical Therapy

## 2013-01-01 DIAGNOSIS — IMO0001 Reserved for inherently not codable concepts without codable children: Secondary | ICD-10-CM | POA: Diagnosis not present

## 2013-02-14 ENCOUNTER — Other Ambulatory Visit: Payer: Self-pay | Admitting: Obstetrics and Gynecology

## 2013-03-08 ENCOUNTER — Other Ambulatory Visit: Payer: Self-pay | Admitting: Obstetrics and Gynecology

## 2013-03-08 NOTE — H&P (Addendum)
Kathy Stuart is a 37 y.o. female P3-0-0-4 presents for hysteroscopy, dilatation, curettage and endometrial ablation because of dysfunctional uterine bleeding. Since age 13 the patient has had very heavy periods however, after the birth of her twins in 2008 her flow became heavier. Over the years she's tried oral contraceptives and evening primrose oil with only minimal decrease in her flow volume. Her menses lasts for six days with pad and tampon change 1-2 times an hour and clothing on a regular basis due to frequent soiling by gushes of blood. Her cramping is rated as a 5/10 on a 10 point pain scale and as only had one episode of inter-menstrual bleeding over the years. She denies dyspareunia, changes in bowel habits of urinary tract symptoms. An endometrial biopsy performed in July 2014 was benign showing no atypia, malignancy or hyperplasia. A pelvic ultrasound done at the same time showed a uterus-8.30 x 6.68 x 5.59 cm, endometrium-0.672 cm; right ovary-3.20 x 2.37 x 1.98 cm and left ovary-4.14 x 2.52 x 2.30 cm. In recent months, the patient has enjoyed some relief from her bleeding by using Lysteda however, she desired a more permanent solution and underwent an attempted endometrial ablation procedure in July 2014, however, due to uterine perforation, the procedure could not be completed.  Since she has healed from that event she wants to try once again to complete an endometrial ablation.   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 Remote history of abnormal PAP smear in 1994, normal since; Last PAP smear: May 2014   Medical History: Thyromegaly, anemia, migraines, herniated lumbar disc  Surgical History: 2014 Tubal Sterilization  Denies problems with anesthesia or history of blood  transfusions   Family History: Hypertension, COPD, Bipolar Disorder, Alcoholism, Stroke, Heart Disease, Depression, Thyroid Disease and Arthritis   Social History: Married and Works at Home; Former smoker, rare alcohol intake and denies illicit drugs   Medications:  Ibuprofen 600 mg every 6 hours as needed Calcium-D daily  Ferrous Sulfate 325 mg daily  Zyrtec 5 mg  prn   No Known Allergies   Denies sensitivity to peanuts, shellfish, soy, latex or adhesives.   ROS: Admits to glasses, low back pain, SI joint dysfunction and constipation; Denies headache, vision changes, nasal congestion, dysphagia, tinnitus, dizziness, hoarseness, cough, chest pain, shortness of breath, nausea, vomiting, diarrhea, urinary frequency, urgency dysuria, hematuria, vaginitis symptoms, pelvic pain, swelling of joints,easy bruising, myalgias, skin rashes, unexplained weight loss and except as is mentioned in the history of present illness, patient's review of systems is otherwise negative.    Physical Exam   Bp: 105/76  P:100   R: 12   Temp.: 98.2 degrees F orally   Weight: 321 lbs   Height: 5'8"     BMI: 48.8  Neck: supple without masses or thyromegaly  Lungs: clear to auscultation  Heart: regular rate and rhythm  Abdomen: soft, non-tender and no organomegaly  Pelvic:EGBUS- wnl; vagina-normal rugae; uterus-normal size, cervix without lesions or motion tenderness; adnexae-no tenderness or masses  Extremities: no clubbing, cyanosis or edema   UPT-negative  Assesment: Dysfunctional Uterine Bleeding   Disposition: A discussion was held with patient regarding the indication for her procedure(s) along with the risks, which include but are not limited to: reaction to anesthesia, damage to adjacent   organs, infection and excessive bleeding. The patient verbalized understanding of these risks and has consented to proceed with Hysteroscopy, Dilatation, Curettage and Endometrial Ablation at [facility], March 23, 2013 at  9 a.m.  CSN# [id number]   Shaniqwa Horsman J. Tyla Burgner, PA-C for Dr. Angela Y. Roberts     

## 2013-03-19 ENCOUNTER — Encounter (HOSPITAL_COMMUNITY)
Admission: RE | Admit: 2013-03-19 | Discharge: 2013-03-19 | Disposition: A | Discharge status: Home / Self Care | Payer: BC Managed Care – PPO | Source: Ambulatory Visit | Attending: Obstetrics and Gynecology | Admitting: Obstetrics and Gynecology

## 2013-03-19 ENCOUNTER — Encounter (INDEPENDENT_AMBULATORY_CARE_PROVIDER_SITE_OTHER): Payer: Self-pay

## 2013-03-19 ENCOUNTER — Encounter (HOSPITAL_COMMUNITY): Payer: Self-pay

## 2013-03-19 DIAGNOSIS — Z01818 Encounter for other preprocedural examination: Secondary | ICD-10-CM | POA: Insufficient documentation

## 2013-03-19 DIAGNOSIS — Z01812 Encounter for preprocedural laboratory examination: Secondary | ICD-10-CM | POA: Insufficient documentation

## 2013-03-19 HISTORY — DX: Shortness of breath: R06.02

## 2013-03-19 LAB — CBC
Hemoglobin: 13.5 g/dL (ref 12.0–15.0)
MCH: 27.8 pg (ref 26.0–34.0)
MCV: 83.7 fL (ref 78.0–100.0)
Platelets: 222 10*3/uL (ref 150–400)
RBC: 4.85 MIL/uL (ref 3.87–5.11)

## 2013-03-19 NOTE — Patient Instructions (Signed)
Your procedure is scheduled on: 03/23/13  Enter through the Main Entrance at :8am Pick up desk phone and dial [phone] and inform us of your arrival.  Please call [phone] if you have any problems the morning of surgery.  Remember: Do not eat food or drink liquids, including water, after midnight: Thursda   You may brush your teeth the morning of surgery.   DO NOT wear jewelry, eye make-up, lipstick,body lotion, or dark fingernail polish.  (Polished toes are ok) You may wear deodorant.   Patients discharged on the day of surgery will not be allowed to drive home. Wear loose fitting, comfortable clothes for your ride home.

## 2013-03-23 ENCOUNTER — Encounter (HOSPITAL_COMMUNITY): Payer: BC Managed Care – PPO | Admitting: Anesthesiology

## 2013-03-23 ENCOUNTER — Encounter (HOSPITAL_COMMUNITY)
Admission: RE | Disposition: A | Discharge status: Home / Self Care | Payer: Self-pay | Source: Ambulatory Visit | Attending: Obstetrics and Gynecology

## 2013-03-23 ENCOUNTER — Ambulatory Visit (HOSPITAL_COMMUNITY): Payer: BC Managed Care – PPO | Admitting: Anesthesiology

## 2013-03-23 ENCOUNTER — Encounter (HOSPITAL_COMMUNITY): Payer: Self-pay | Admitting: *Deleted

## 2013-03-23 ENCOUNTER — Ambulatory Visit (HOSPITAL_COMMUNITY)
Admission: RE | Admit: 2013-03-23 | Discharge: 2013-03-23 | Disposition: A | Discharge status: Home / Self Care | Payer: BC Managed Care – PPO | Source: Ambulatory Visit | Attending: Obstetrics and Gynecology | Admitting: Obstetrics and Gynecology

## 2013-03-23 DIAGNOSIS — N920 Excessive and frequent menstruation with regular cycle: Secondary | ICD-10-CM | POA: Insufficient documentation

## 2013-03-23 DIAGNOSIS — N938 Other specified abnormal uterine and vaginal bleeding: Secondary | ICD-10-CM | POA: Insufficient documentation

## 2013-03-23 DIAGNOSIS — N949 Unspecified condition associated with female genital organs and menstrual cycle: Secondary | ICD-10-CM | POA: Insufficient documentation

## 2013-03-23 HISTORY — PX: DILITATION & CURRETTAGE/HYSTROSCOPY WITH NOVASURE ABLATION: SHX5568

## 2013-03-23 SURGERY — DILATATION & CURETTAGE/HYSTEROSCOPY WITH NOVASURE ABLATION
Anesthesia: General | Site: Uterus | Wound class: Clean Contaminated

## 2013-03-23 MED ORDER — PROPOFOL 10 MG/ML IV EMUL
INTRAVENOUS | Status: AC
  Filled 2013-03-23: qty 40

## 2013-03-23 MED ORDER — MEPERIDINE HCL 25 MG/ML IJ SOLN: 6.2500 mg | INTRAMUSCULAR | Status: DC | PRN

## 2013-03-23 MED ORDER — MIDAZOLAM HCL 2 MG/2ML IJ SOLN: 0.5000 mg | Freq: Once | INTRAMUSCULAR | Status: DC | PRN

## 2013-03-23 MED ORDER — OXYCODONE-ACETAMINOPHEN 5-325 MG PO TABS: 1.0000 | ORAL_TABLET | Freq: Four times a day (QID) | ORAL | Status: AC | PRN

## 2013-03-23 MED ORDER — LACTATED RINGERS IV SOLN
INTRAVENOUS | Status: DC
  Administered 2013-03-23 (×2): via INTRAVENOUS

## 2013-03-23 MED ORDER — GLYCOPYRROLATE 0.2 MG/ML IJ SOLN
INTRAMUSCULAR | Status: AC
  Filled 2013-03-23: qty 1

## 2013-03-23 MED ORDER — PROMETHAZINE HCL 25 MG/ML IJ SOLN: 6.2500 mg | INTRAMUSCULAR | Status: DC | PRN

## 2013-03-23 MED ORDER — KETOROLAC TROMETHAMINE 30 MG/ML IJ SOLN
INTRAMUSCULAR | Status: AC
  Filled 2013-03-23: qty 1

## 2013-03-23 MED ORDER — MIDAZOLAM HCL 2 MG/2ML IJ SOLN
INTRAMUSCULAR | Status: AC
  Filled 2013-03-23: qty 2

## 2013-03-23 MED ORDER — FENTANYL CITRATE 0.05 MG/ML IJ SOLN: 25.0000 ug | INTRAMUSCULAR | Status: DC | PRN

## 2013-03-23 MED ORDER — LIDOCAINE HCL (CARDIAC) 20 MG/ML IV SOLN
INTRAVENOUS | Status: DC | PRN
  Administered 2013-03-23: 50 mg via INTRAVENOUS

## 2013-03-23 MED ORDER — KETOROLAC TROMETHAMINE 30 MG/ML IJ SOLN
INTRAMUSCULAR | Status: DC | PRN
  Administered 2013-03-23: 30 mg via INTRAVENOUS

## 2013-03-23 MED ORDER — ONDANSETRON HCL 4 MG/2ML IJ SOLN
INTRAMUSCULAR | Status: DC | PRN
  Administered 2013-03-23: 4 mg via INTRAVENOUS

## 2013-03-23 MED ORDER — MIDAZOLAM HCL 2 MG/2ML IJ SOLN
INTRAMUSCULAR | Status: DC | PRN
  Administered 2013-03-23: 2 mg via INTRAVENOUS

## 2013-03-23 MED ORDER — FENTANYL CITRATE 0.05 MG/ML IJ SOLN
INTRAMUSCULAR | Status: AC
  Filled 2013-03-23: qty 2

## 2013-03-23 MED ORDER — GLYCOPYRROLATE 0.2 MG/ML IJ SOLN
INTRAMUSCULAR | Status: DC | PRN
  Administered 2013-03-23: 0.2 mg via INTRAVENOUS

## 2013-03-23 MED ORDER — LIDOCAINE HCL (CARDIAC) 20 MG/ML IV SOLN
INTRAVENOUS | Status: AC
  Filled 2013-03-23: qty 5

## 2013-03-23 MED ORDER — FENTANYL CITRATE 0.05 MG/ML IJ SOLN
INTRAMUSCULAR | Status: DC | PRN
  Administered 2013-03-23 (×4): 50 ug via INTRAVENOUS

## 2013-03-23 MED ORDER — IBUPROFEN 600 MG PO TABS: 600.0000 mg | ORAL_TABLET | Freq: Four times a day (QID) | ORAL | Status: AC | PRN

## 2013-03-23 MED ORDER — LACTATED RINGERS IR SOLN
Status: DC | PRN
  Administered 2013-03-23: 3000 mL

## 2013-03-23 MED ORDER — ONDANSETRON HCL 4 MG/2ML IJ SOLN
INTRAMUSCULAR | Status: AC
  Filled 2013-03-23: qty 2

## 2013-03-23 MED ORDER — LIDOCAINE HCL 1 % IJ SOLN
INTRAMUSCULAR | Status: AC
  Filled 2013-03-23: qty 20

## 2013-03-23 MED ORDER — PROPOFOL 10 MG/ML IV BOLUS
INTRAVENOUS | Status: DC | PRN
  Administered 2013-03-23: 300 mg via INTRAVENOUS

## 2013-03-23 MED ORDER — KETOROLAC TROMETHAMINE 30 MG/ML IJ SOLN: 15.0000 mg | Freq: Once | INTRAMUSCULAR | Status: DC | PRN

## 2013-03-23 MED ORDER — LIDOCAINE HCL 1 % IJ SOLN
INTRAMUSCULAR | Status: DC | PRN
  Administered 2013-03-23: 20 mL

## 2013-03-23 SURGICAL SUPPLY — 14 items
ABLATOR ENDOMETRIAL BIPOLAR (ABLATOR) ×2 IMPLANT
CATH ROBINSON RED A/P 16FR (CATHETERS) ×2 IMPLANT
CLOTH BEACON ORANGE TIMEOUT ST (SAFETY) ×2 IMPLANT
CONTAINER PREFILL 10% NBF 60ML (FORM) ×4 IMPLANT
DRESSING TELFA 8X3 (GAUZE/BANDAGES/DRESSINGS) ×2 IMPLANT
GLOVE BIO SURGEON STRL SZ7.5 (GLOVE) ×2 IMPLANT
GLOVE BIOGEL PI IND STRL 7.5 (GLOVE) ×1 IMPLANT
GLOVE BIOGEL PI INDICATOR 7.5 (GLOVE) ×1
GOWN STRL REIN XL XLG (GOWN DISPOSABLE) ×6 IMPLANT
PACK HYSTEROSCOPY LF (CUSTOM PROCEDURE TRAY) ×2 IMPLANT
PAD OB MATERNITY 4.3X12.25 (PERSONAL CARE ITEMS) ×2 IMPLANT
PAD PREP 24X48 CUFFED NSTRL (MISCELLANEOUS) ×2 IMPLANT
TOWEL OR 17X24 6PK STRL BLUE (TOWEL DISPOSABLE) ×4 IMPLANT
WATER STERILE IRR 1000ML POUR (IV SOLUTION) ×2 IMPLANT

## 2013-03-23 NOTE — H&P (View-Only) (Signed)
Kathy Stuart is a 37 y.o. female P3-0-0-4 presents for hysteroscopy, dilatation, curettage and endometrial ablation because of dysfunctional uterine bleeding. Since age 54 the patient has had very heavy periods however, after the birth of her twins in 2008 her flow became heavier. Over the years she's tried oral contraceptives and evening primrose oil with only minimal decrease in her flow volume. Her menses lasts for six days with pad and tampon change 1-2 times an hour and clothing on a regular basis due to frequent soiling by gushes of blood. Her cramping is rated as a 5/10 on a 10 point pain scale and as only had one episode of inter-menstrual bleeding over the years. She denies dyspareunia, changes in bowel habits of urinary tract symptoms. An endometrial biopsy performed in July 2014 was benign showing no atypia, malignancy or hyperplasia. A pelvic ultrasound done at the same time showed a uterus-8.30 x 6.68 x 5.59 cm, endometrium-0.672 cm; right ovary-3.20 x 2.37 x 1.98 cm and left ovary-4.14 x 2.52 x 2.30 cm. In recent months, the patient has enjoyed some relief from her bleeding by using Lysteda however, she desired a more permanent solution and underwent an attempted endometrial ablation procedure in July 2014, however, due to uterine perforation, the procedure could not be completed.  Since she has healed from that event she wants to try once again to complete an endometrial ablation.   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 Remote history of abnormal PAP smear in 1994, normal since; Last PAP smear: May 2014   Medical History: Thyromegaly, anemia, migraines, herniated lumbar disc  Surgical History: 2014 Tubal Sterilization  Denies problems with anesthesia or history of blood  transfusions   Family History: Hypertension, COPD, Bipolar Disorder, Alcoholism, Stroke, Heart Disease, Depression, Thyroid Disease and Arthritis   Social History: Married and Works at [employer]; Former smoker, rare alcohol intake and denies illicit drugs   Medications:  Ibuprofen 600 mg every 6 hours as needed Calcium-D daily  Ferrous Sulfate 325 mg daily  Zyrtec 5 mg  prn   No Known Allergies   Denies sensitivity to peanuts, shellfish, soy, latex or adhesives.   ROS: Admits to glasses, low back pain, SI joint dysfunction and constipation; Denies headache, vision changes, nasal congestion, dysphagia, tinnitus, dizziness, hoarseness, cough, chest pain, shortness of breath, nausea, vomiting, diarrhea, urinary frequency, urgency dysuria, hematuria, vaginitis symptoms, pelvic pain, swelling of joints,easy bruising, myalgias, skin rashes, unexplained weight loss and except as is mentioned in the history of present illness, patient's review of systems is otherwise negative.    Physical Exam   Bp: 105/76  P:100   R: 12   Temp.: 98.2 degrees F orally   Weight: 321 lbs   Height: 5'8"     BMI: 48.8  Neck: supple without masses or thyromegaly  Lungs: clear to auscultation  Heart: regular rate and rhythm  Abdomen: soft, non-tender and no organomegaly  Pelvic:EGBUS- wnl; vagina-normal rugae; uterus-normal size, cervix without lesions or motion tenderness; adnexae-no tenderness or masses  Extremities: no clubbing, cyanosis or edema   UPT-negative  Assesment: Dysfunctional Uterine Bleeding   Disposition: A discussion was held with patient regarding the indication for her procedure(s) along with the risks, which include but are not limited to: reaction to anesthesia, damage to adjacent  organs, infection and excessive bleeding. The patient verbalized understanding of these risks and has consented to proceed with Hysteroscopy, Dilatation, Curettage and Endometrial Ablation at [facility], March 23, 2013 at  9 a.m.  CSN# [id number]   Javarian Jakubiak J. Lowell Guitar, PA-C for Dr. Woodroe Mode. Su Hilt

## 2013-03-23 NOTE — Op Note (Signed)
Preop Diagnosis: Menorrhagia; CPT 58563 - 60 minutes   Postop Diagnosis: Menorrhagia  Procedure: DILATATION & CURETTAGE/HYSTEROSCOPY WITH NOVASURE ABLATION   Anesthesia: General   Anesthesiologist: Brayton Caves, MD   Attending: Purcell Nails, MD   Assistant: N/a  Findings: Uterine sound 10cm, Cervical length 5cm, Cavity length 5cm, Cavity Width 3.4cm, Power 94watts, Time 28secs  Pathology: Endometrial Curettings  Fluids: See flowsheet  UOP: 80cc  EBL: Minimal   Complications: None  Procedure:The patient was taken to the operating room after the risks, benefits and alternatives discussed with the patient. The patient verbalized understanding and consent signed and witnessed. The patient was given a spinal per anesthesia and prepped and draped in the normal sterile fashion and Time Out performed per protocol. A bivalve speculum was placed in the patient's vagina and the anterior lip of the cervix was grasped with a single tooth tenaculum. A paracervical block was administered using a total of 10 cc of 1% lidocaine. The uterus sounded to 10 cm. The cervix was dilated for passage of the hysteroscope. The hysteroscope was introduced into the uterine cavity and findings as noted above. Sharp curettage was performed until a gritty texture was noted and currettings sent to pathology. The hysteroscope was reintroduced and no obvious remaining intracavitary lesions were noted. The Novasure instrument was introduced and ablation performed without difficulty. Hysteroscope reintroduced and ablation noted only in the lower uterine segment where the cavity takes an acute angle to the fundus.  Decision was made to do a hydrothermal ablation in that fundal region which had approc 3cm of length.  HTA was performed and the machine stopped once with report of cavity expansion or leak from tubing at a rate of 4ml.  Machine was restarted and after of ablation the machine stopped with a  ?leak at a rate of 3ml.  Ablation results for that remaining fundal region looked adequate and decision was made to discontinue remainder of procedure.  There was possibly a 1-2cm area in left cornual region that was not adequately ablated but when examined again with the hysteroscopy this area looked good as well.  Sponge lap and needle count was correct. The patient tolerated the procedure well and was returned to the recovery room in good condition.

## 2013-03-23 NOTE — Transfer of Care (Signed)
Immediate Anesthesia Transfer of Care Note  Patient: Kathy Stuart  Procedure(s) Performed: Procedure(s): DILATATION & CURETTAGE/HYSTEROSCOPY ATTEMPTED NOVASURE ABLATION, HYDROTHERMOABLATION W/GENESYS (N/A)  Patient Location: PACU  Anesthesia Type:General  Level of Consciousness: awake, alert  and oriented  Airway & Oxygen Therapy: Patient Spontanous Breathing and Patient connected to nasal cannula oxygen  Post-op Assessment: Report given to PACU RN and Post -op Vital signs reviewed and stable  Post vital signs: Reviewed and stable  Complications: No apparent anesthesia complications

## 2013-03-23 NOTE — Anesthesia Preprocedure Evaluation (Signed)
Anesthesia Evaluation  Patient identified by MRN, date of birth, ID band Patient awake    Reviewed: Allergy & Precautions, H&P , Patient's Chart, lab work & pertinent test results, reviewed documented beta blocker date and time   History of Anesthesia Complications Negative for: history of anesthetic complications  Airway Mallampati: III TM Distance: >3 FB Neck ROM: full    Dental no notable dental hx.    Pulmonary neg pulmonary ROS, shortness of breath,  breath sounds clear to auscultation  Pulmonary exam normal       Cardiovascular Exercise Tolerance: Good + Peripheral Vascular Disease negative cardio ROS  Rhythm:regular Rate:Normal     Neuro/Psych  Headaches, negative neurological ROS  negative psych ROS   GI/Hepatic negative GI ROS, Neg liver ROS,   Endo/Other  negative endocrine ROSMorbid obesity  Renal/GU negative Renal ROS     Musculoskeletal   Abdominal   Peds  Hematology negative hematology ROS (+) anemia ,   Anesthesia Other Findings   Reproductive/Obstetrics negative OB ROS                           Anesthesia Physical Anesthesia Plan  ASA: III  Anesthesia Plan: General LMA   Post-op Pain Management:    Induction:   Airway Management Planned:   Additional Equipment:   Intra-op Plan:   Post-operative Plan:   Informed Consent: I have reviewed the patients History and Physical, chart, labs and discussed the procedure including the risks, benefits and alternatives for the proposed anesthesia with the patient or authorized representative who has indicated his/her understanding and acceptance.   Dental Advisory Given  Plan Discussed with: CRNA, Surgeon and Anesthesiologist  Anesthesia Plan Comments:         Anesthesia Quick Evaluation

## 2013-03-23 NOTE — Anesthesia Postprocedure Evaluation (Signed)
Anesthesia Post Note  Patient: Kathy Stuart  Procedure(s) Performed: Procedure(s) (LRB): DILATATION & CURETTAGE/HYSTEROSCOPY ATTEMPTED NOVASURE ABLATION, HYDROTHERMOABLATION W/GENESYS (N/A)  Anesthesia type: General  Patient location: PACU  Post pain: Pain level controlled  Post assessment: Post-op Vital signs reviewed  Last Vitals:  Filed Vitals:   03/23/13 1130  BP: 139/83  Pulse: 71  Temp:   Resp: 19    Post vital signs: Reviewed  Level of consciousness: sedated  Complications: No apparent anesthesia complications

## 2013-03-23 NOTE — Interval H&P Note (Signed)
History and Physical Interval Note:  03/23/2013 8:28 AM  Kathy Stuart  has presented today for surgery, with the diagnosis of Menorrhagia; CPT [id number] - 60 minutes  The various methods of treatment have been discussed with the patient and family. After consideration of risks, benefits and other options for treatment, the patient has consented to  Procedure(s): DILATATION & CURETTAGE/HYSTEROSCOPY WITH NOVASURE ABLATION (N/A) as a surgical intervention .  The patient's history has been reviewed, patient examined, no change in status, stable for surgery.  I have reviewed the patient's chart and labs.  Questions were answered to the patient's satisfaction.     Purcell Nails

## 2013-03-26 ENCOUNTER — Encounter (HOSPITAL_COMMUNITY): Payer: Self-pay | Admitting: Obstetrics and Gynecology

## 2013-09-24 DIAGNOSIS — R7309 Other abnormal glucose: Secondary | ICD-10-CM | POA: Insufficient documentation

## 2013-11-19 ENCOUNTER — Other Ambulatory Visit: Payer: Self-pay | Admitting: Obstetrics and Gynecology

## 2013-11-19 DIAGNOSIS — E041 Nontoxic single thyroid nodule: Secondary | ICD-10-CM

## 2013-11-22 ENCOUNTER — Ambulatory Visit
Admission: RE | Admit: 2013-11-22 | Discharge: 2013-11-22 | Disposition: A | Discharge status: Home / Self Care | Payer: BC Managed Care – PPO | Source: Ambulatory Visit | Attending: Obstetrics and Gynecology | Admitting: Obstetrics and Gynecology

## 2013-11-22 DIAGNOSIS — E041 Nontoxic single thyroid nodule: Secondary | ICD-10-CM

## 2014-01-07 ENCOUNTER — Other Ambulatory Visit: Payer: Self-pay | Admitting: Internal Medicine

## 2014-01-07 DIAGNOSIS — E041 Nontoxic single thyroid nodule: Secondary | ICD-10-CM

## 2014-01-17 ENCOUNTER — Ambulatory Visit
Admission: RE | Admit: 2014-01-17 | Discharge: 2014-01-17 | Disposition: A | Discharge status: Home / Self Care | Payer: BC Managed Care – PPO | Source: Ambulatory Visit | Attending: Internal Medicine | Admitting: Internal Medicine

## 2014-01-17 ENCOUNTER — Other Ambulatory Visit (HOSPITAL_COMMUNITY)
Admission: RE | Admit: 2014-01-17 | Discharge: 2014-01-17 | Disposition: A | Discharge status: Home / Self Care | Payer: BC Managed Care – PPO | Source: Ambulatory Visit | Attending: Interventional Radiology | Admitting: Interventional Radiology

## 2014-01-17 DIAGNOSIS — E041 Nontoxic single thyroid nodule: Secondary | ICD-10-CM | POA: Insufficient documentation

## 2014-03-25 ENCOUNTER — Encounter (HOSPITAL_COMMUNITY): Payer: Self-pay | Admitting: Obstetrics and Gynecology

## 2014-08-18 IMAGING — US US SOFT TISSUE HEAD/NECK
1 series · 14 of 25 positions shown · non-contrast
Comparison: None.

CLINICAL DATA: Followup thyroid nodules

EXAM:
THYROID ULTRASOUND
TECHNIQUE: Ultrasound examination of the thyroid gland and adjacent soft
tissues was performed.

[Series 1: us soft tissue head/neck · 0.08mm/px · 14 of 58 slices shown]
[im 1/58]
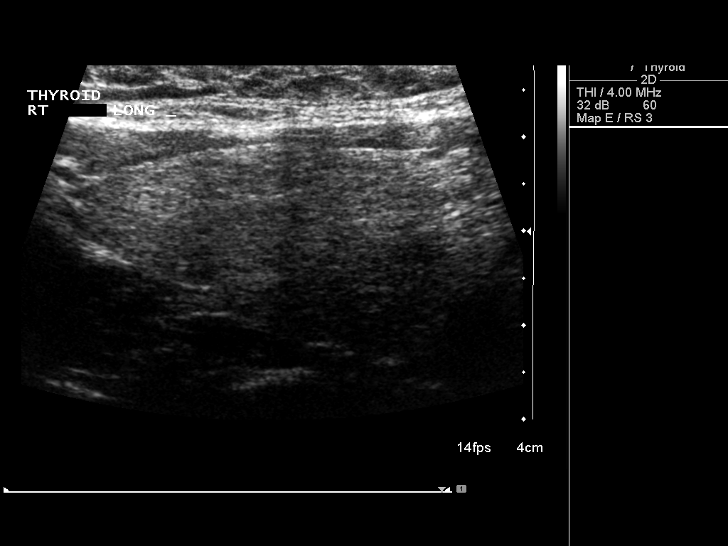
[im 5/58]
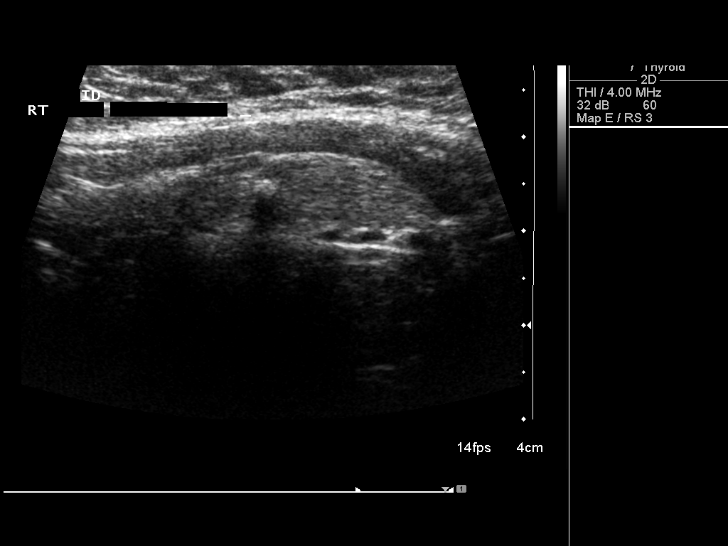
[im 10/58]
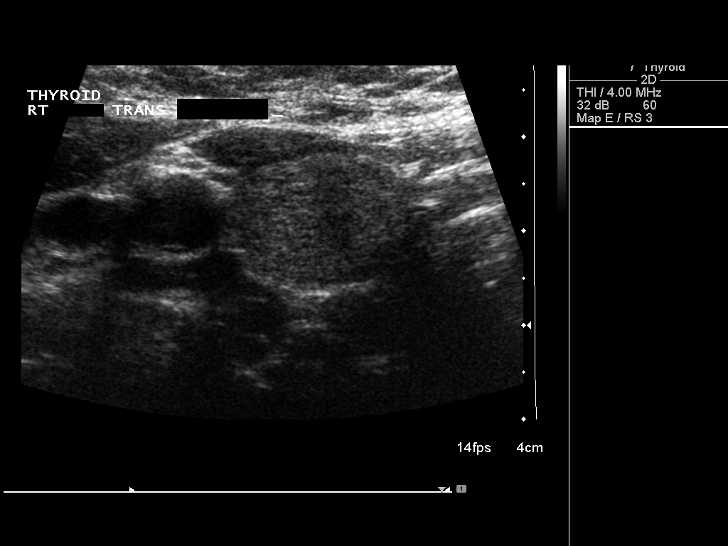
[im 15/58]
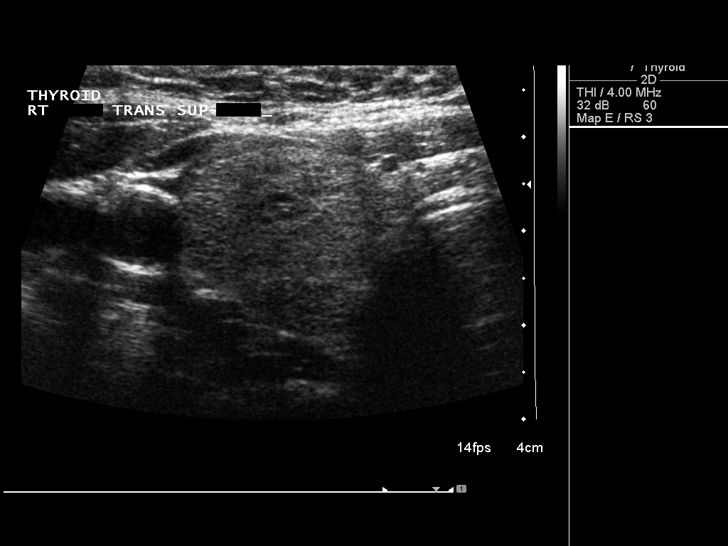
[im 20/58]
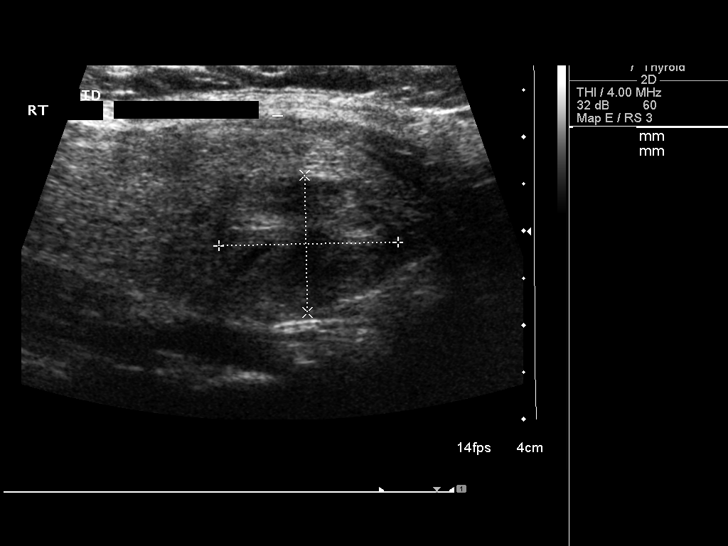
[im 22/58]
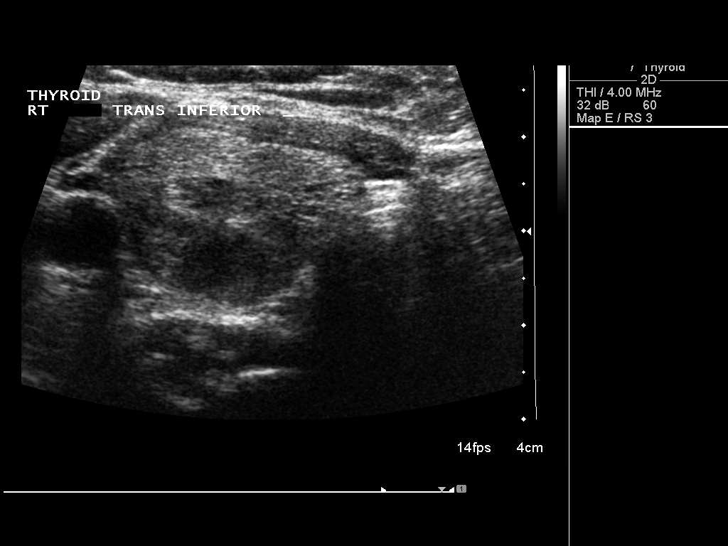
[im 27/58]
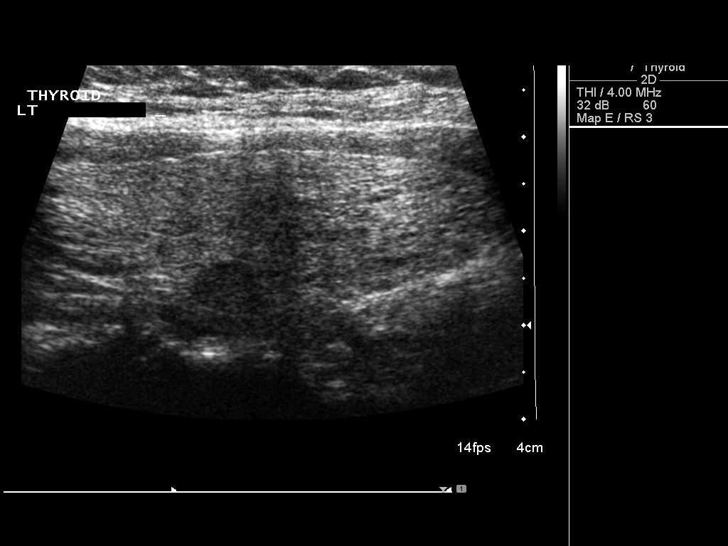
[im 31/58]
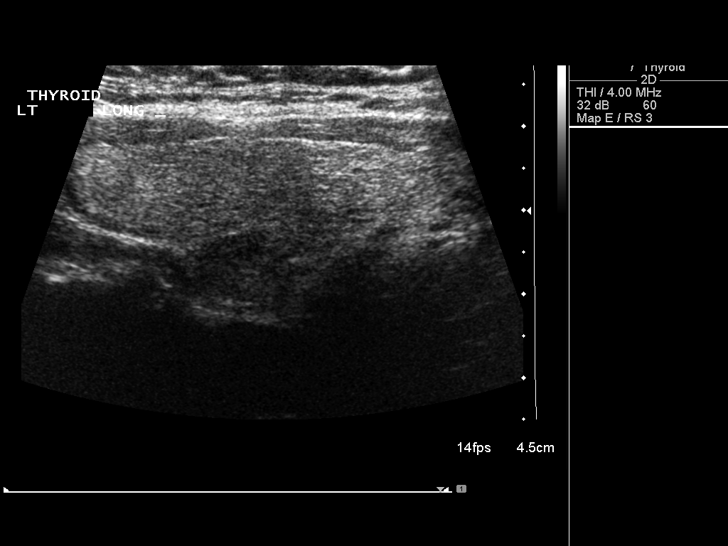
[im 36/58]
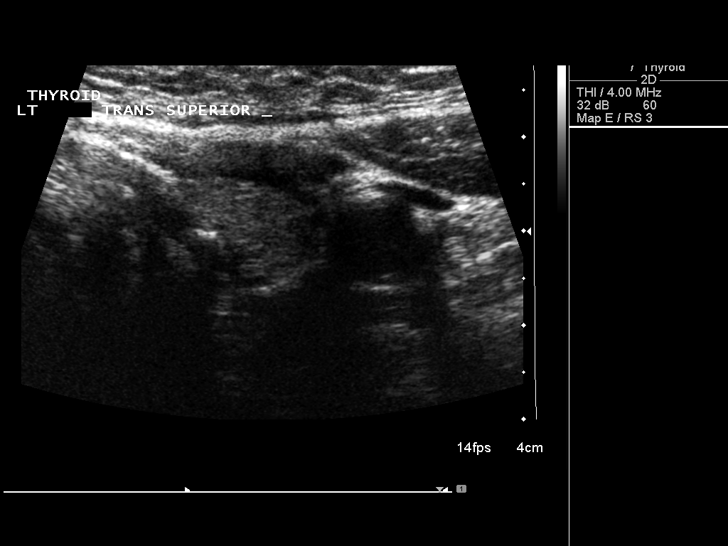
[im 39/58]
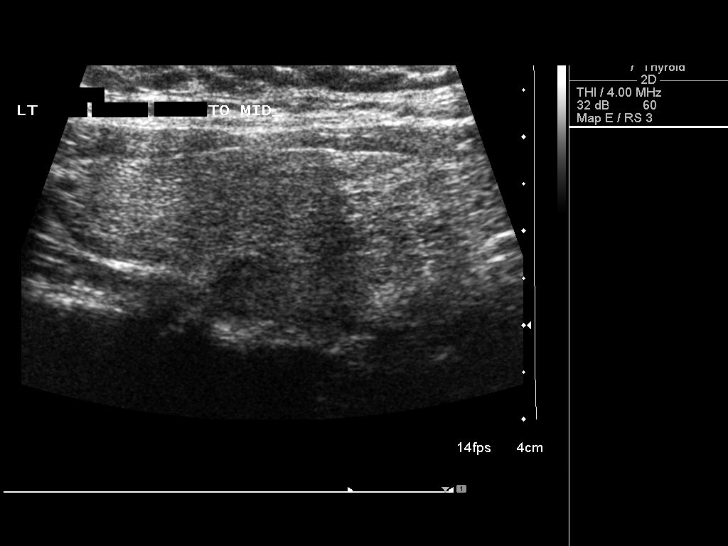
[im 43/58]
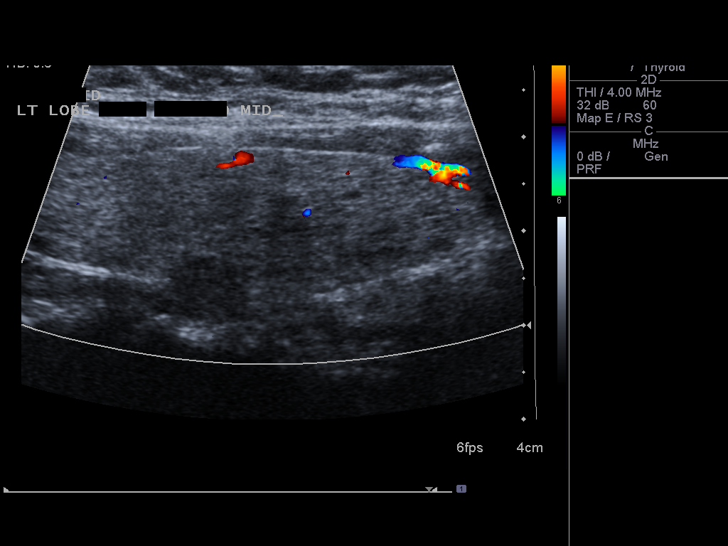
[im 48/58]
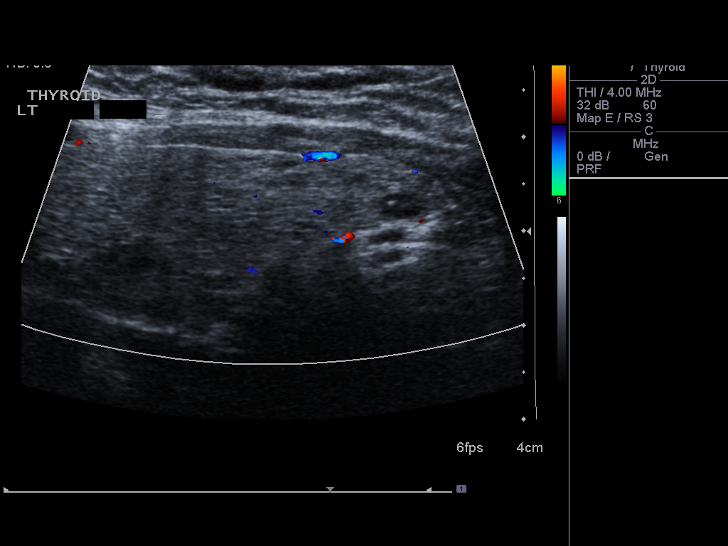
[im 53/58]
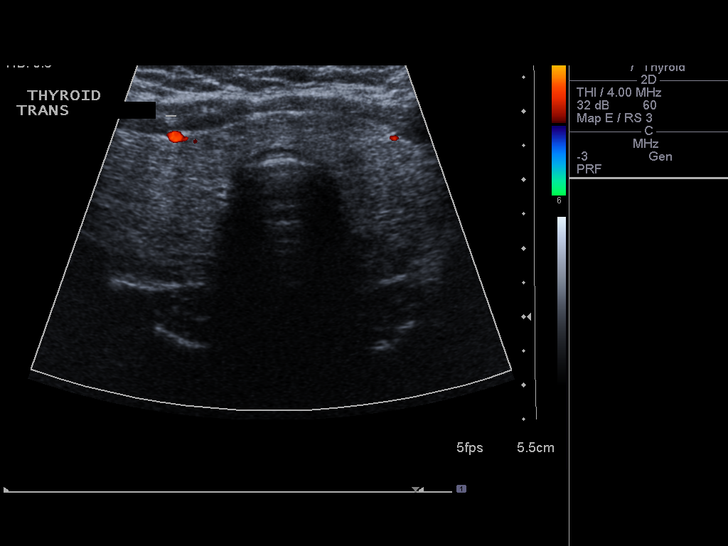
[im 58/58]
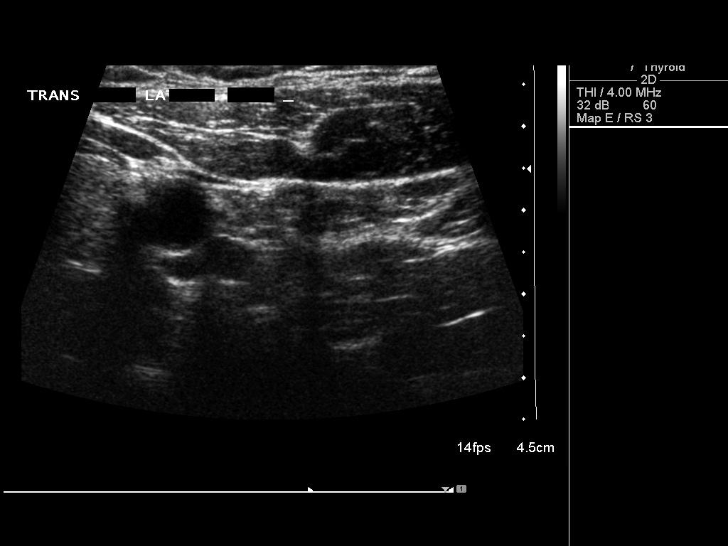

[14 of 25 positions shown; findings below may reference images not displayed]

FINDINGS: Right thyroid lobe

Measurements: 6.0 x 2.3 x 2.9 cm. Dominant 1.9 x 1.5 x 2.0 cm
predominantly solid right lower pole nodule. 5 mm upper pole nodule.

Left thyroid lobe

Measurements: 5.0 x 1.2 x 2.2 cm. 1.1 x 0.7 x 0.9 cm left lower pole
solid nodule. 5 mm tiny lower pole cysts.

Isthmus

Thickness: 5 mm in thickness.  No nodules visualized.

Lymphadenopathy

None visualized.
IMPRESSION: Bilateral nodules. The dominant right lower pole nodule measures
cm. Findings meet consensus criteria for biopsy. Ultrasound-guided
fine needle aspiration should be considered, as per the consensus
statement: Management of Thyroid Nodules Detected at US: Society of
Radiologists in Ultrasound Consensus Conference Statement. Radiology

## 2014-10-13 IMAGING — US US THYROID BIOPSY
1 series · 7 of 7 positions shown · non-contrast
Comparison: 11/22/2013

CLINICAL DATA: Dominant right inferior thyroid nodule

EXAM:
ULTRASOUND GUIDED NEEDLE ASPIRATE BIOPSY OF THE THYROID GLAND

[Series 1: us thyroid biopsy · 7 acquisitions, 7 frames shown]
[im 1/7]
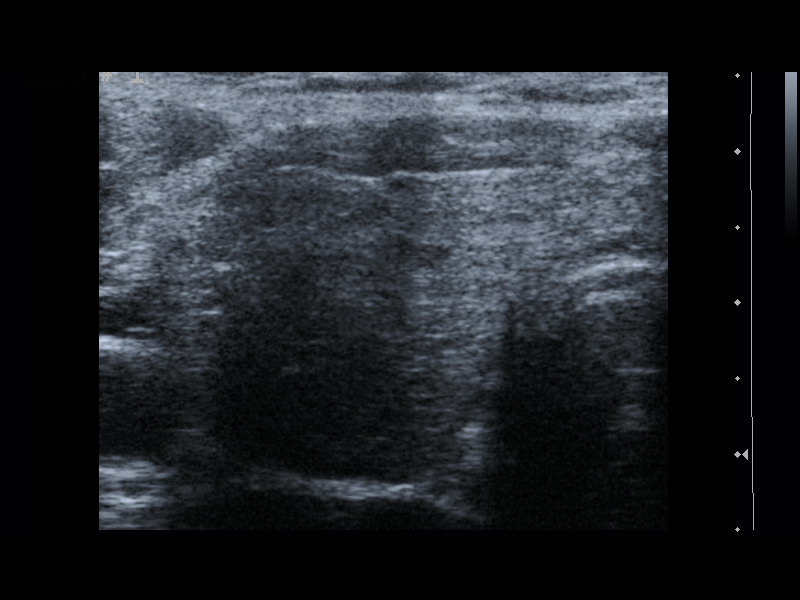
[im 2/7]
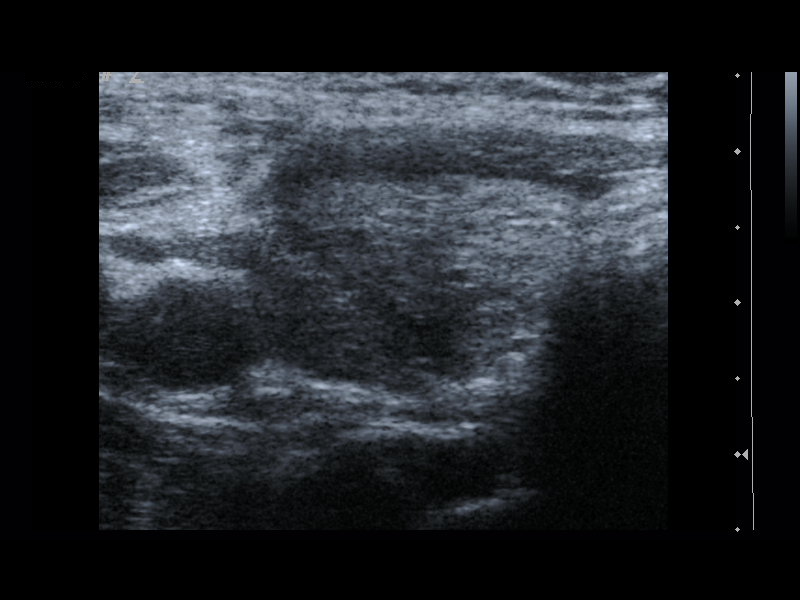
[im 3/7]
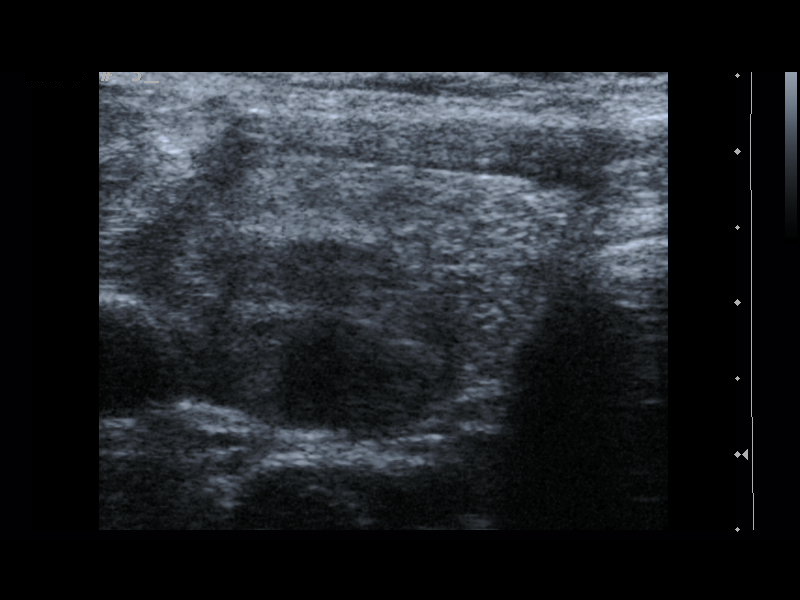
[im 4/7]
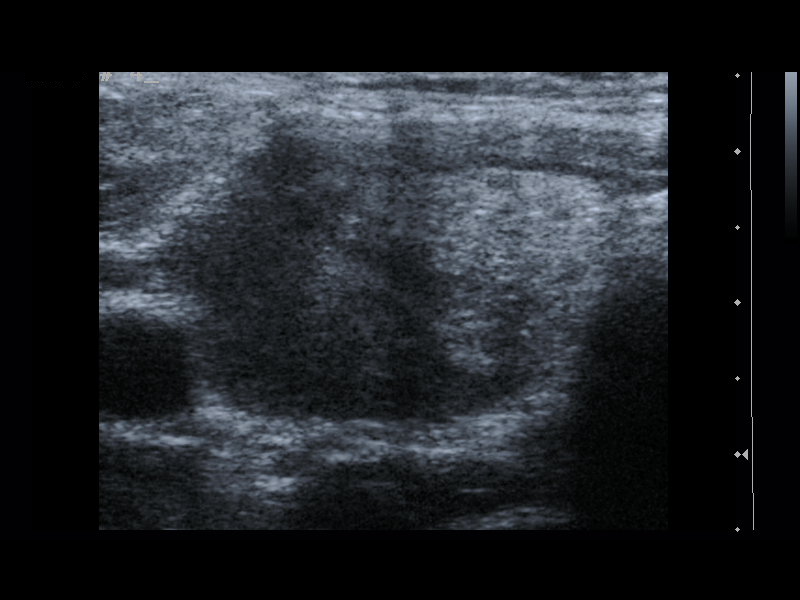
[im 5/7]
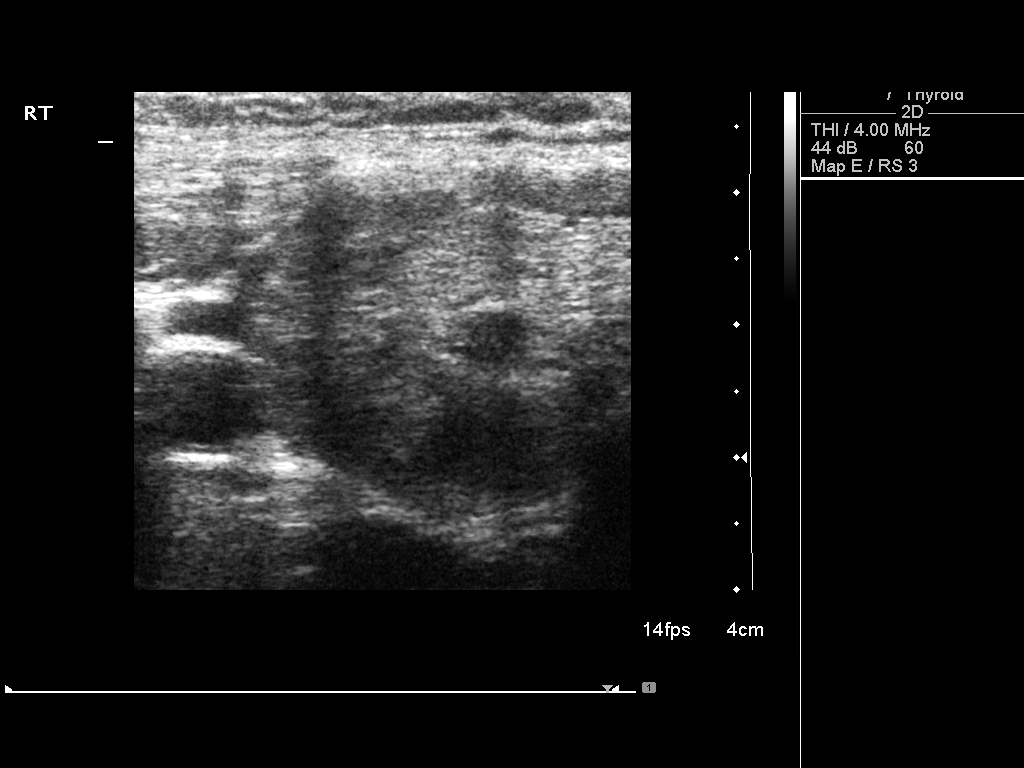
[im 6/7]
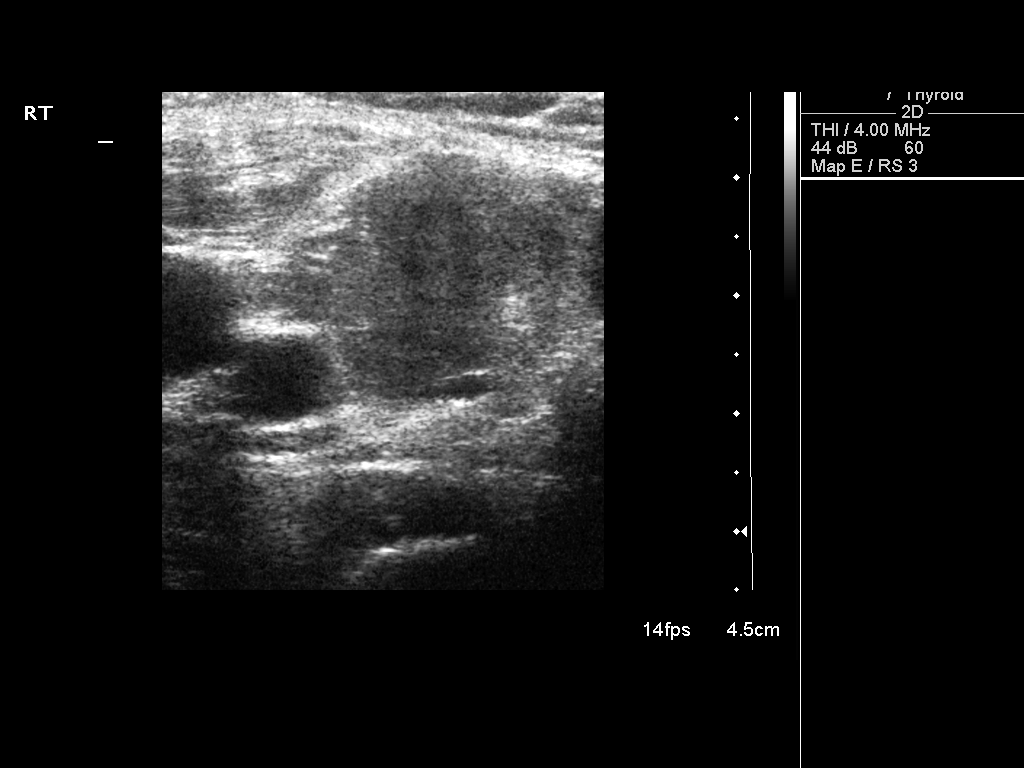
[im 7/7]
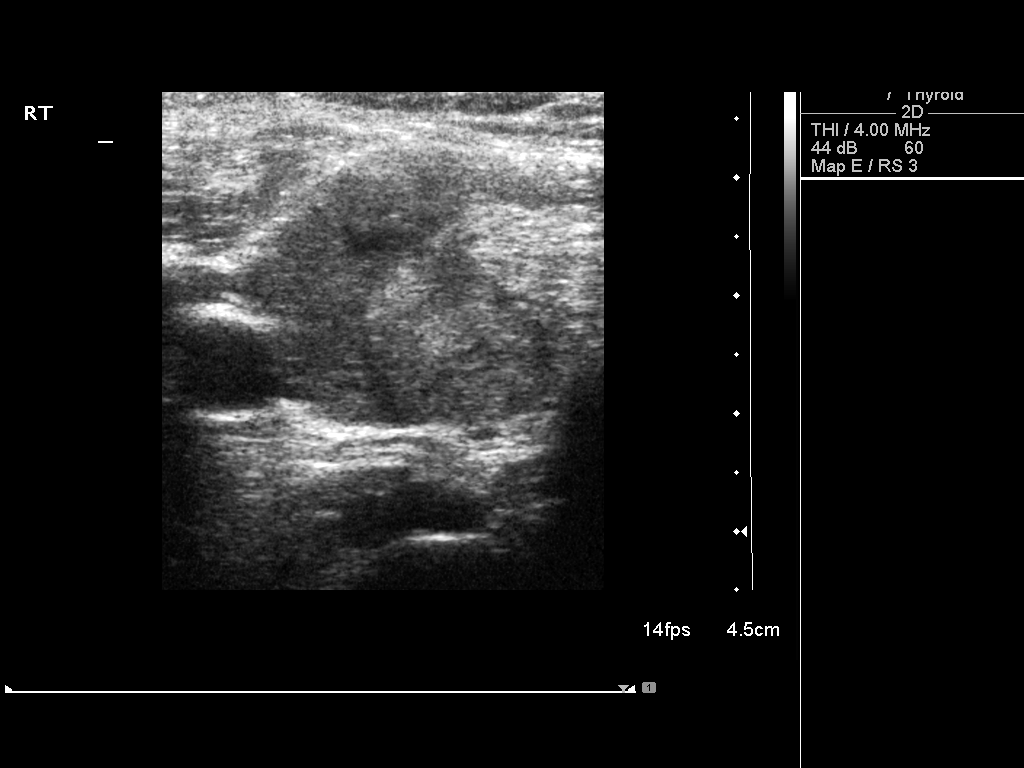

[7 of 7 positions shown; findings below may reference images not displayed]

PROCEDURE:
Thyroid biopsy was thoroughly discussed with the patient and
questions were answered. The benefits, risks, alternatives, and
complications were also discussed. The patient understands and
wishes to proceed with the procedure. Written consent was obtained.

Ultrasound was performed to localize and mark an adequate site for
the biopsy. The patient was then prepped and draped in a normal
sterile fashion. Local anesthesia was provided with 1% lidocaine.
Using direct ultrasound guidance, 4 passes were made using needles
into the nodule within the right lobe of the thyroid. Ultrasound was
used to confirm needle placements on all occasions. Specimens were
sent to Pathology for analysis.

Complications:  Minor perithyroid hemorrhage post biopsy
FINDINGS: Imaging confirms needle placement in the dominant right inferior
thyroid nodule
IMPRESSION: Ultrasound guided needle aspirate biopsy performed of the dominant
right inferior thyroid nodule.

## 2015-01-03 ENCOUNTER — Other Ambulatory Visit: Payer: Self-pay | Admitting: Internal Medicine

## 2015-01-03 DIAGNOSIS — E041 Nontoxic single thyroid nodule: Secondary | ICD-10-CM

## 2015-01-08 ENCOUNTER — Ambulatory Visit (HOSPITAL_COMMUNITY)
Admission: RE | Admit: 2015-01-08 | Discharge: 2015-01-08 | Disposition: A | Discharge status: Home / Self Care | Payer: BLUE CROSS/BLUE SHIELD | Source: Ambulatory Visit | Attending: Internal Medicine | Admitting: Internal Medicine

## 2015-01-08 DIAGNOSIS — E042 Nontoxic multinodular goiter: Secondary | ICD-10-CM | POA: Diagnosis present

## 2015-01-08 DIAGNOSIS — E041 Nontoxic single thyroid nodule: Secondary | ICD-10-CM

## 2016-12-01 IMAGING — US US SOFT TISSUE HEAD/NECK
1 series · 14 of 25 positions shown · non-contrast
Comparison: Prior ultrasound exams on 11/22/2013 and 04/11/2009.
Imaging during biopsy procedure on 01/17/2014.

CLINICAL DATA: History of thyroid nodules with previous biopsy of 2
cm dominant right inferior thyroid nodule on 01/17/2014 revealing
findings consistent with non neoplastic goiter.

EXAM:
THYROID ULTRASOUND
TECHNIQUE: Ultrasound examination of the thyroid gland and adjacent soft
tissues was performed.

[Series 1: us soft tissue head/neck · 0.04mm/px · 14 of 43 slices shown]
[im 1/43]
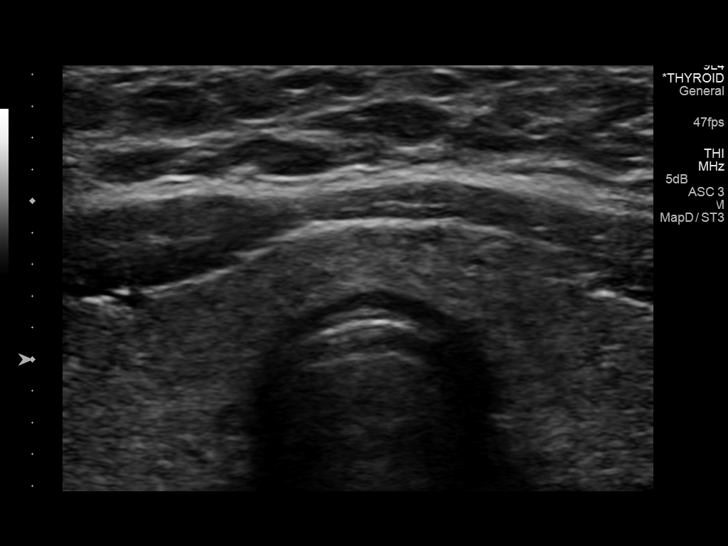
[im 4/43]
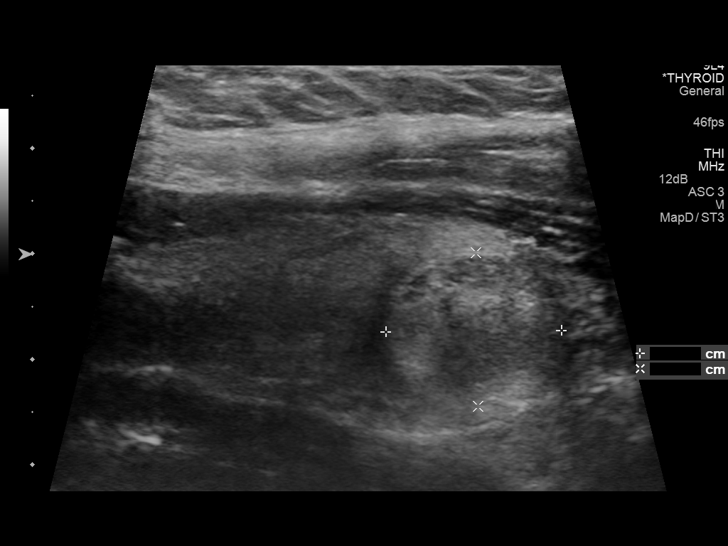
[im 8/43]
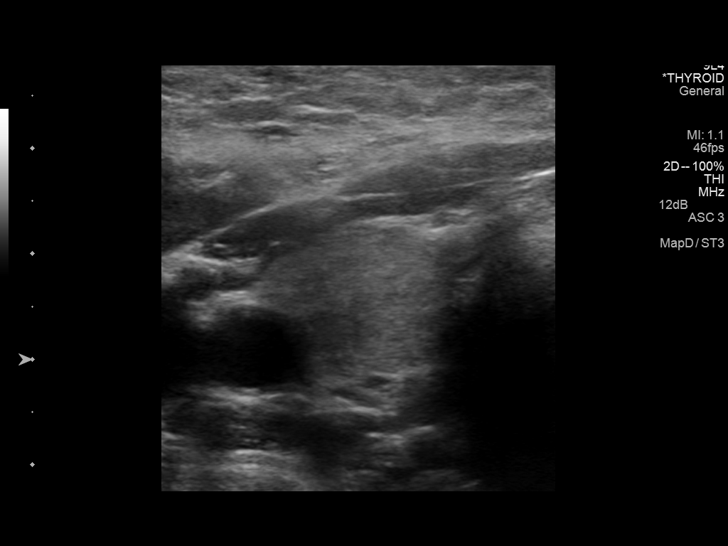
[im 11/43]
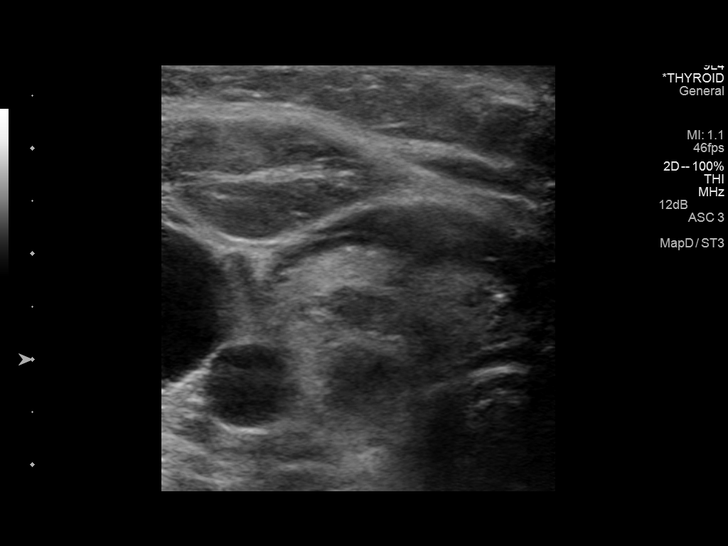
[im 15/43]
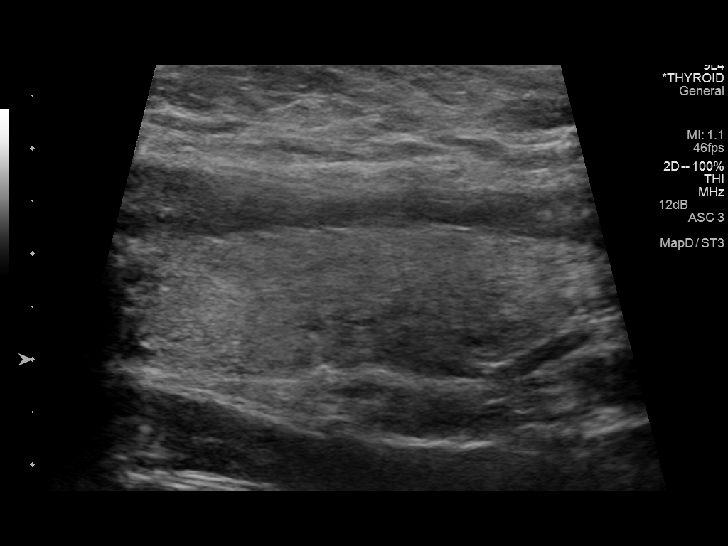
[im 16/43]
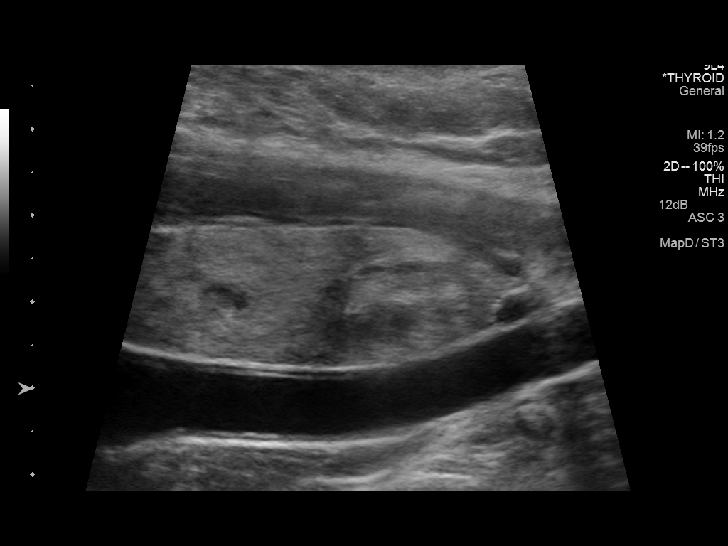
[im 20/43]
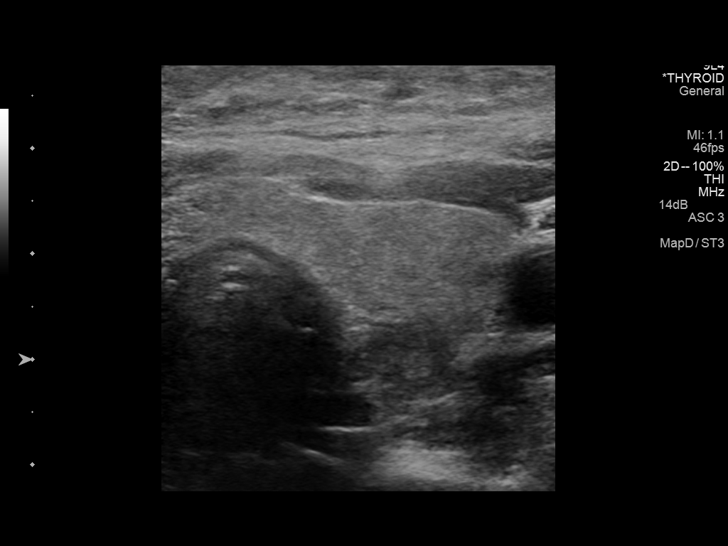
[im 23/43]
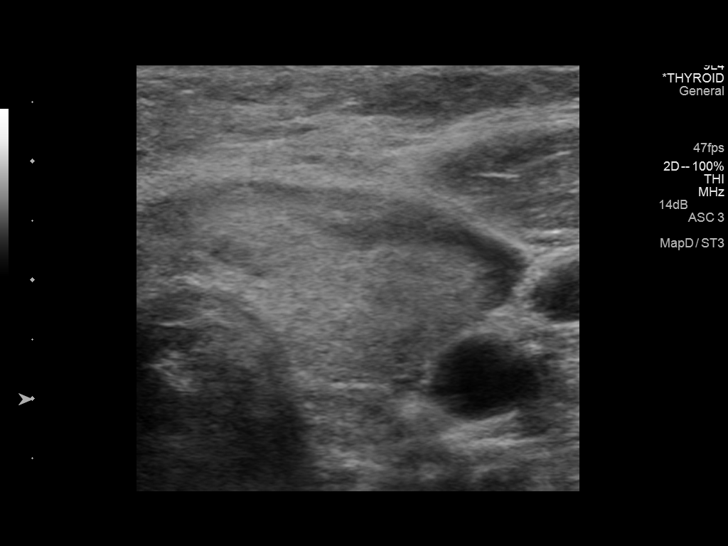
[im 27/43]
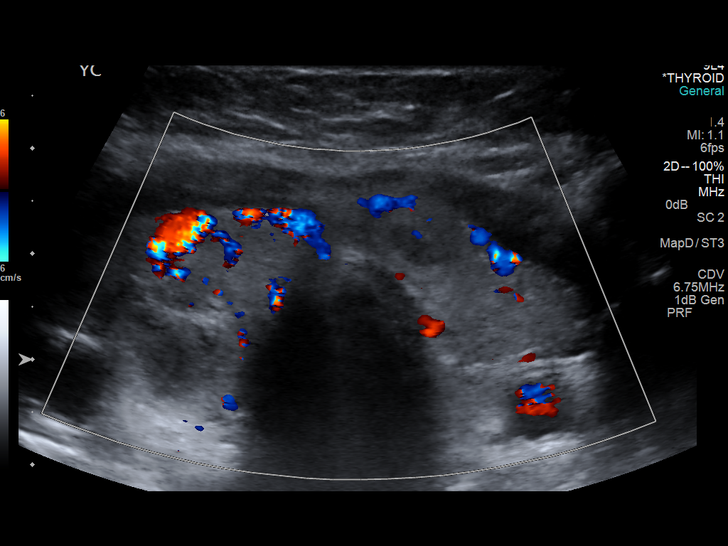
[im 29/43]
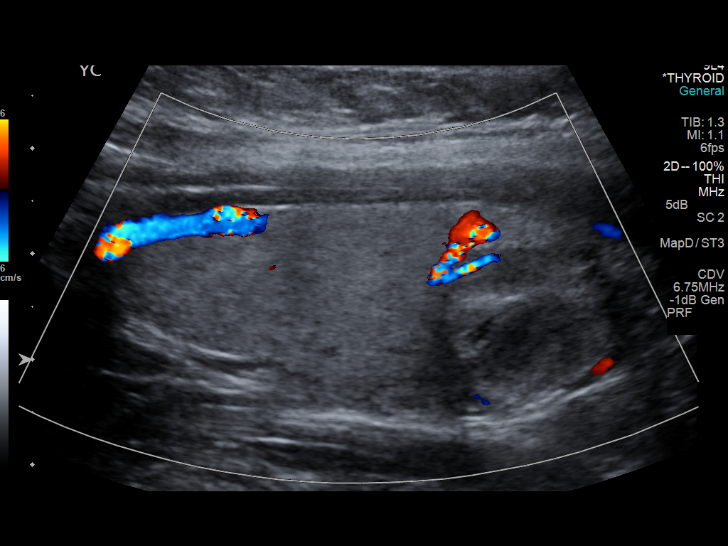
[im 32/43]
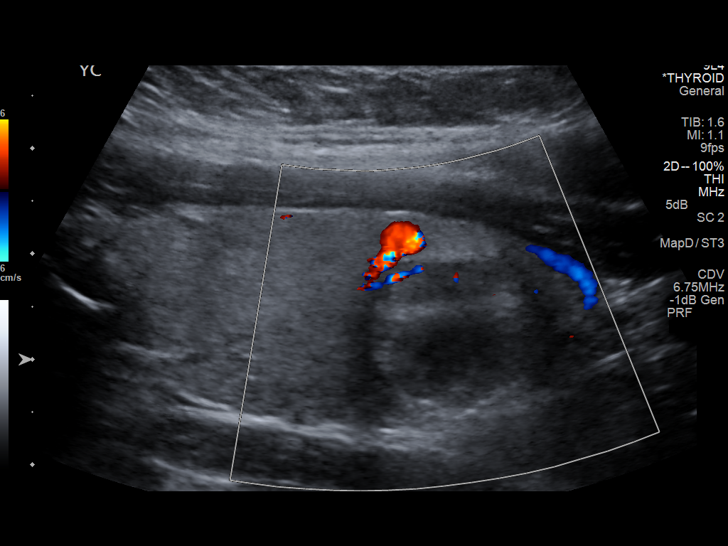
[im 36/43]
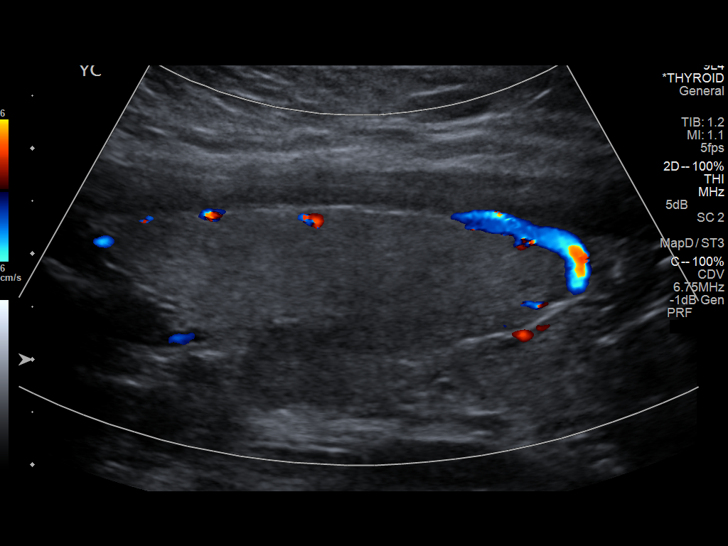
[im 39/43]
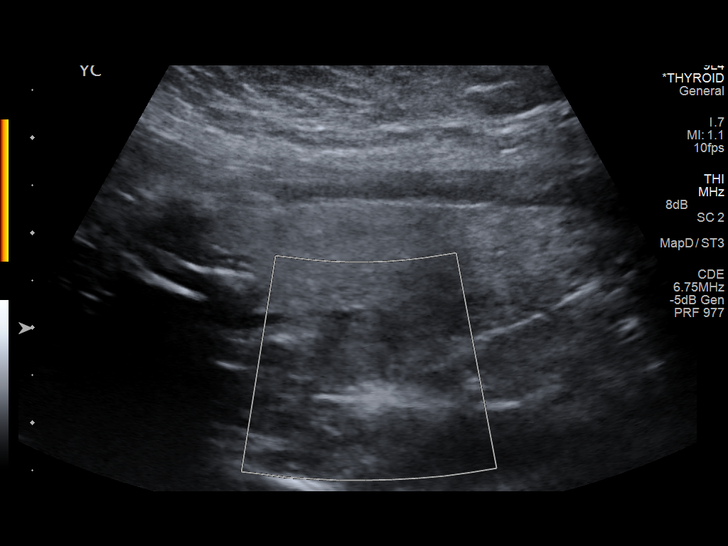
[im 43/43]
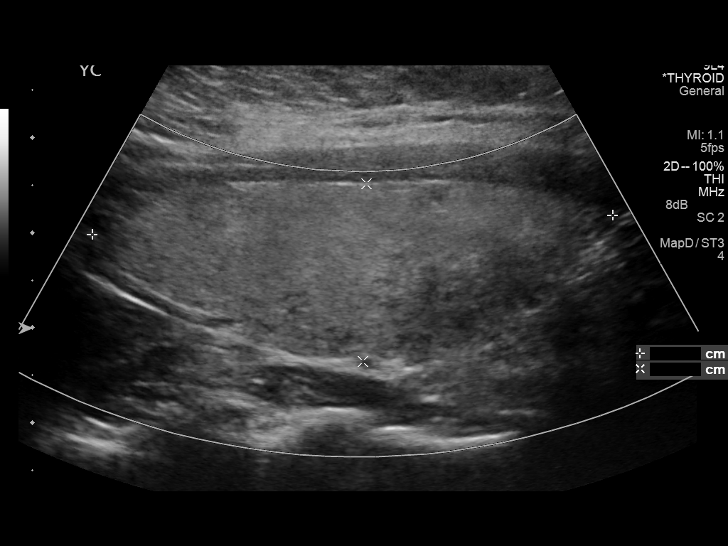

[14 of 25 positions shown; findings below may reference images not displayed]

FINDINGS: Right thyroid lobe

Measurements: 5.7 x 2.0 x 2.2 cm (slightly smaller dimensions). The
dominant inferior right thyroid nodule appears slightly smaller
compared to the prior study, measuring approximately 1.7 x 1.6 x
cm (1.9 x 1.5 x 2.0 cm previously).

Left thyroid lobe

Measurements: 5.2 x 2.0 x 1.9 cm (stable size). Solid nodule in the
inferior left lobe that is relatively hypoechoic appears stable and
measures approximately 1.0 x 0.8 x 1.0 cm.

Isthmus

Thickness: 0.5 cm.  No nodules visualized.

Lymphadenopathy

None visualized.
IMPRESSION: Slightly smaller dimensions of the inferior right thyroid nodule.
Stable 1 cm left thyroid nodule.

## 2019-02-05 ENCOUNTER — Ambulatory Visit (INDEPENDENT_AMBULATORY_CARE_PROVIDER_SITE_OTHER): Payer: BC Managed Care – PPO

## 2019-02-05 ENCOUNTER — Other Ambulatory Visit: Payer: Self-pay

## 2019-02-05 ENCOUNTER — Ambulatory Visit (INDEPENDENT_AMBULATORY_CARE_PROVIDER_SITE_OTHER): Payer: BC Managed Care – PPO | Admitting: Podiatry

## 2019-02-05 ENCOUNTER — Encounter: Payer: Self-pay | Admitting: Podiatry

## 2019-02-05 VITALS — BP 127/76

## 2019-02-05 DIAGNOSIS — M25471 Effusion, right ankle: Secondary | ICD-10-CM | POA: Diagnosis not present

## 2019-02-05 DIAGNOSIS — M779 Enthesopathy, unspecified: Secondary | ICD-10-CM

## 2019-02-05 DIAGNOSIS — M2142 Flat foot [pes planus] (acquired), left foot: Secondary | ICD-10-CM

## 2019-02-05 DIAGNOSIS — E041 Nontoxic single thyroid nodule: Secondary | ICD-10-CM | POA: Insufficient documentation

## 2019-02-05 DIAGNOSIS — M79671 Pain in right foot: Secondary | ICD-10-CM

## 2019-02-05 DIAGNOSIS — M25472 Effusion, left ankle: Secondary | ICD-10-CM

## 2019-02-05 DIAGNOSIS — N920 Excessive and frequent menstruation with regular cycle: Secondary | ICD-10-CM | POA: Insufficient documentation

## 2019-02-05 DIAGNOSIS — M79672 Pain in left foot: Secondary | ICD-10-CM | POA: Diagnosis not present

## 2019-02-05 DIAGNOSIS — M2141 Flat foot [pes planus] (acquired), right foot: Secondary | ICD-10-CM

## 2019-02-05 DIAGNOSIS — L309 Dermatitis, unspecified: Secondary | ICD-10-CM | POA: Insufficient documentation

## 2019-02-05 DIAGNOSIS — N912 Amenorrhea, unspecified: Secondary | ICD-10-CM | POA: Insufficient documentation

## 2019-02-05 DIAGNOSIS — M778 Other enthesopathies, not elsewhere classified: Secondary | ICD-10-CM

## 2019-02-05 DIAGNOSIS — N946 Dysmenorrhea, unspecified: Secondary | ICD-10-CM | POA: Insufficient documentation

## 2019-02-05 MED ORDER — CYCLOBENZAPRINE HCL 10 MG PO TABS: 10.0000 mg | ORAL_TABLET | Freq: Every day | ORAL | 1 refills | Status: AC

## 2019-02-07 NOTE — Progress Notes (Signed)
   Subjective:  43 y.o. female presenting today as a new patient with a chief complaint of cramping pain noted to the plantar aspects of the feet bilaterally that began several years ago. She reports associated burning sensation. Walking during the night increases the pain. She has been using OTC orthotics with no significant relief. Patient is here for further evaluation and treatment.   Past Medical History:  Diagnosis Date  . Abnormal Pap smear 1994   COLPO; LAST PAP 03/2011  . Anemia    As a child; CHRONIC  . Fibroid 1998  . H/O constipation 2009  . H/O low back pain 2010  . H/O varicella   . H/O: menorrhagia 2010  . H/O: obesity 2010  . Headache(784.0)    MIGRAINES  . HPV in female   . Increased BMI 2009  . Infection    UTI  . Infection    YEAST  . Infection    SINUSITIS 2-3 X/YEAR  . Irregular periods/menstrual cycles 2009   Heavy  . Peripheral vascular disease (HCC)    VARICOSE VEINS  . Preterm labor 2008   TWIN PREGNANCY  . Recurrent upper respiratory infection (URI)    SINUSITIS 2-3 X/YEAR  . S/P repeat low transverse C-section 08/31/2012  . Shortness of breath    when exercising  . Thyromegaly 2010       Objective/Physical Exam General: The patient is alert and oriented x3 in no acute distress.  Dermatology: Skin is warm, dry and supple bilateral lower extremities. Negative for open lesions or macerations.  Vascular: Bilateral foot and ankle edema noted. Palpable pedal pulses bilaterally. No erythema noted. Capillary refill within normal limits.  Neurological: Epicritic and protective threshold grossly intact bilaterally.   Musculoskeletal Exam: Range of motion within normal limits to all pedal and ankle joints bilateral. Muscle strength 5/5 in all groups bilateral.  Upon weightbearing there is a medial longitudinal arch collapse bilaterally. Remove foot valgus noted to the bilateral lower extremities with excessive pronation upon mid stance. Pain with  palpation noted to the Pain with palpation noted to the bilateral midfoot.   Radiographic Exam:  Normal osseous mineralization. Joint spaces preserved. No fracture/dislocation/boney destruction.   Pes planus noted on radiographic exam lateral views. Decreased calcaneal inclination and metatarsal declination angle is noted. Anterior break in the cyma line noted on lateral views. Medial talar head to deviation noted on AP radiograph.   Assessment: 1. pes planus bilateral 2. Bilateral foot and ankle edema 3. Capsulitis bilateral midfoot 4. Nocturnal leg cramps bilateral   Plan of Care:  1. Patient was evaluated. X-Rays reviewed.  2. Appointment with Liliane Channel, Pedorthist, for custom molded orthotics.  3. Prescription for Flexeril 5 mg QHS provided to patient.  4. Compression anklets dispensed bilaterally.  5. Return to clinic as needed.    Edrick Kins, DPM [facility]  Dr. Edrick Kins, [address]                                        [place], [place] [address]                Office [phone]  Fax [phone]

## 2019-02-14 ENCOUNTER — Other Ambulatory Visit: Payer: BC Managed Care – PPO | Admitting: Orthotics

## 2019-03-05 ENCOUNTER — Telehealth: Payer: Self-pay | Admitting: Podiatry

## 2019-03-05 NOTE — Telephone Encounter (Signed)
Pt left message stating she thinks she was to have an appt with Liliane Channel to be measured for orthotics and she may have missed it.    I called and left message appt was missed and to call me back and I can reschedule to next week.

## 2019-03-21 ENCOUNTER — Encounter: Payer: BC Managed Care – PPO | Admitting: Orthotics

## 2019-03-27 ENCOUNTER — Ambulatory Visit (INDEPENDENT_AMBULATORY_CARE_PROVIDER_SITE_OTHER): Payer: BC Managed Care – PPO | Admitting: Orthotics

## 2019-03-27 ENCOUNTER — Other Ambulatory Visit: Payer: Self-pay | Admitting: Podiatry

## 2019-03-27 ENCOUNTER — Other Ambulatory Visit: Payer: Self-pay

## 2019-03-27 DIAGNOSIS — M778 Other enthesopathies, not elsewhere classified: Secondary | ICD-10-CM

## 2019-03-27 DIAGNOSIS — M25472 Effusion, left ankle: Secondary | ICD-10-CM

## 2019-03-27 DIAGNOSIS — M779 Enthesopathy, unspecified: Secondary | ICD-10-CM

## 2019-03-27 DIAGNOSIS — M25471 Effusion, right ankle: Secondary | ICD-10-CM

## 2019-03-27 DIAGNOSIS — M2141 Flat foot [pes planus] (acquired), right foot: Secondary | ICD-10-CM

## 2019-03-27 NOTE — Progress Notes (Signed)
Patient presents today with a hx of PTTD/AAF.  Upon assessment, patient has pronounced pes planus w/ a valgus RF deformity.  Patient has medially shifted talus/navicular.  Goal is provide longitudinal arch support and RF stability.  Plan on deep heel cup, hug arch, wide foot orthosis w/ medial flange and varus correction for RF valgus deformity.  Patient educated in the progessive nature of PTTD and financial responsibility.  

## 2019-04-24 ENCOUNTER — Ambulatory Visit (INDEPENDENT_AMBULATORY_CARE_PROVIDER_SITE_OTHER): Payer: Self-pay | Admitting: Orthotics

## 2019-04-24 ENCOUNTER — Other Ambulatory Visit: Payer: Self-pay

## 2019-04-24 DIAGNOSIS — M2141 Flat foot [pes planus] (acquired), right foot: Secondary | ICD-10-CM

## 2019-04-24 DIAGNOSIS — M778 Other enthesopathies, not elsewhere classified: Secondary | ICD-10-CM

## 2019-04-24 DIAGNOSIS — M2142 Flat foot [pes planus] (acquired), left foot: Secondary | ICD-10-CM

## 2019-04-26 NOTE — Progress Notes (Signed)
Picked up f/o. No issues.

## 2019-05-03 ENCOUNTER — Telehealth: Payer: Self-pay | Admitting: Podiatry

## 2019-05-03 DIAGNOSIS — M2142 Flat foot [pes planus] (acquired), left foot: Secondary | ICD-10-CM

## 2019-05-03 DIAGNOSIS — M779 Enthesopathy, unspecified: Secondary | ICD-10-CM

## 2019-05-03 DIAGNOSIS — M2141 Flat foot [pes planus] (acquired), right foot: Secondary | ICD-10-CM

## 2019-05-03 NOTE — Telephone Encounter (Signed)
Pt called and is wanting to order a second pair of orthotics just like the ones she received.

## 2019-07-26 ENCOUNTER — Ambulatory Visit: Payer: BC Managed Care – PPO | Attending: Internal Medicine

## 2019-07-28 ENCOUNTER — Ambulatory Visit: Payer: BC Managed Care – PPO | Attending: Internal Medicine

## 2019-07-28 DIAGNOSIS — Z23 Encounter for immunization: Secondary | ICD-10-CM

## 2019-07-28 NOTE — Progress Notes (Signed)
   Covid-19 Vaccination Clinic  Name:  Kathy Stuart    MRN: [id number] DOB: 15-Oct-1975  07/28/2019  Kathy Stuart was observed post Covid-19 immunization for 15 minutes without incident. She was provided with Vaccine Information Sheet and instruction to access the V-Safe system.   Kathy Stuart was instructed to call 911 with any severe reactions post vaccine: Marland Kitchen Difficulty breathing  . Swelling of face and throat  . A fast heartbeat  . A bad rash all over body  . Dizziness and weakness   Immunizations Administered    Name Date Dose VIS Date Route   Pfizer COVID-19 Vaccine 07/28/2019 10:13 AM 0.3 mL 05/04/2019 Intramuscular   Manufacturer: [place]   Lot: [id number]   [place]: [id number]

## 2019-08-18 ENCOUNTER — Ambulatory Visit: Payer: BC Managed Care – PPO | Attending: Internal Medicine

## 2019-08-18 DIAGNOSIS — Z23 Encounter for immunization: Secondary | ICD-10-CM

## 2019-08-18 NOTE — Progress Notes (Signed)
   Covid-19 Vaccination Clinic  Name:  Kathy Stuart    MRN: [id number] DOB: 1975/12/06  08/18/2019  [name] was observed post Covid-19 immunization for 15 minutes without incident. She was provided with Vaccine Information Sheet and instruction to access the V-Safe system.   [name] was instructed to call 911 with any severe reactions post vaccine: Marland Kitchen Difficulty breathing  . Swelling of face and throat  . A fast heartbeat  . A bad rash all over body  . Dizziness and weakness   Immunizations Administered    Name Date Dose VIS Date Route   Pfizer COVID-19 Vaccine 08/18/2019 10:00 AM 0.3 mL 05/04/2019 Intramuscular   Manufacturer: [place]   Lot: [id number]   [place]: [id number]

## 2021-06-30 ENCOUNTER — Other Ambulatory Visit: Payer: Self-pay | Admitting: Internal Medicine

## 2021-06-30 DIAGNOSIS — E041 Nontoxic single thyroid nodule: Secondary | ICD-10-CM

## 2021-07-21 ENCOUNTER — Ambulatory Visit
Admission: RE | Admit: 2021-07-21 | Discharge: 2021-07-21 | Disposition: A | Discharge status: Home / Self Care | Payer: BC Managed Care – PPO | Source: Ambulatory Visit | Attending: Internal Medicine | Admitting: Internal Medicine

## 2021-07-21 ENCOUNTER — Other Ambulatory Visit (HOSPITAL_COMMUNITY)
Admission: RE | Admit: 2021-07-21 | Discharge: 2021-07-21 | Disposition: A | Discharge status: Home / Self Care | Payer: BC Managed Care – PPO | Source: Ambulatory Visit | Attending: Internal Medicine | Admitting: Internal Medicine

## 2021-07-21 DIAGNOSIS — E041 Nontoxic single thyroid nodule: Secondary | ICD-10-CM | POA: Insufficient documentation

## 2021-07-22 LAB — CYTOLOGY - NON PAP

## 2022-04-16 IMAGING — US US FNA BIOPSY THYROID 1ST LESION
1 series · 13 of 13 positions shown · non-contrast
Comparison: Outside imaging, ultrasound 01/08/2015, prior biopsy
01/17/2014

MEDICATIONS:
None

COMPLICATIONS:
None immediate.

INDICATION: Indeterminate thyroid nodule

EXAM:
ULTRASOUND GUIDED FINE NEEDLE ASPIRATION OF INDETERMINATE THYROID
NODULE
TECHNIQUE: Informed written consent was obtained from the patient after a
discussion of the risks, benefits and alternatives to treatment.
Questions regarding the procedure were encouraged and answered. A
timeout was performed prior to the initiation of the procedure.

[Series 1: us fna biopsy thyroid 1st lesion · 0.07mm/px · 13 acquisitions, 13 frames shown]
[im 1/13]
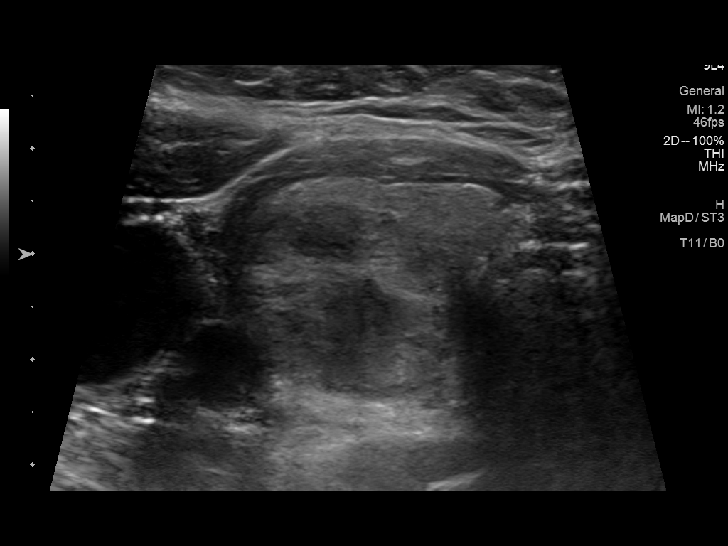
[im 2/13]
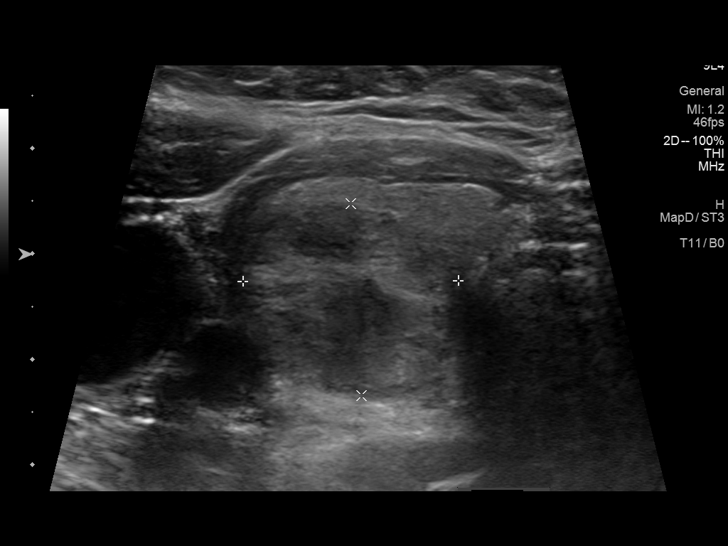
[im 3/13]
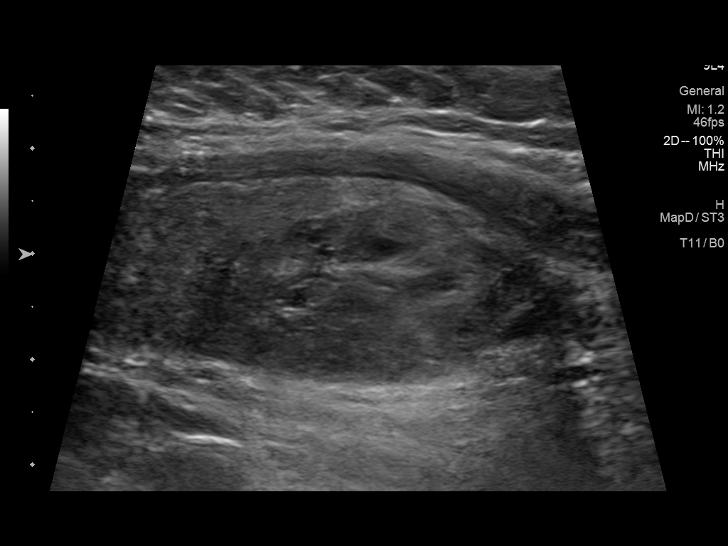
[im 4/13]
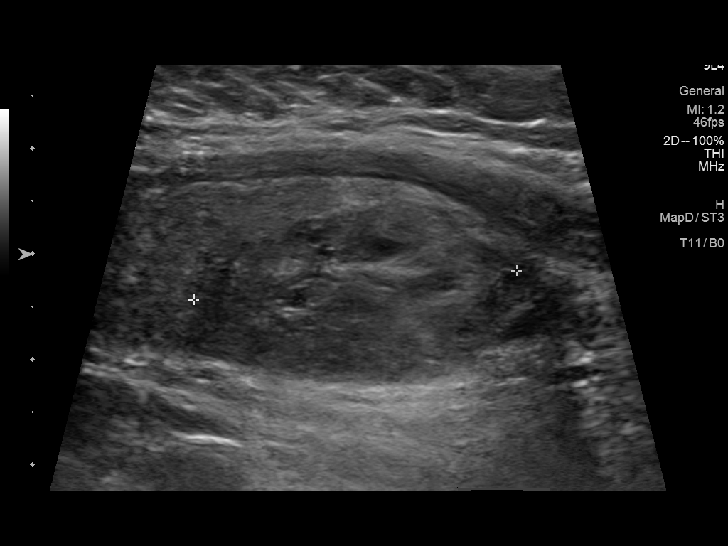
[im 5/13]
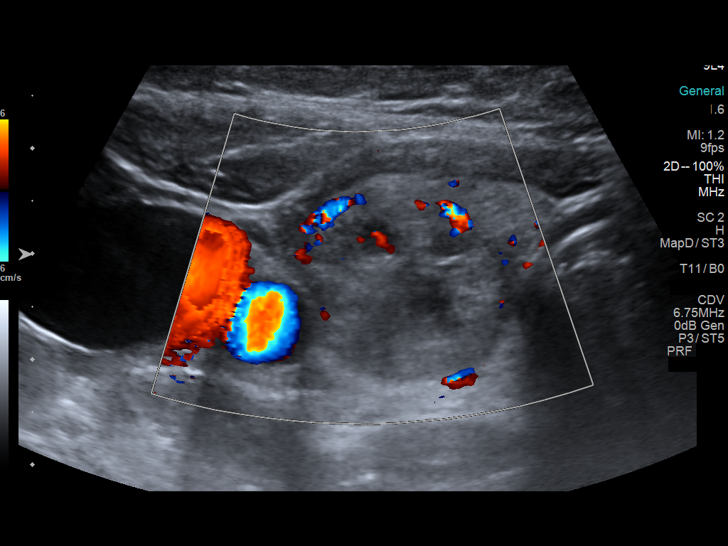
[im 6/13]
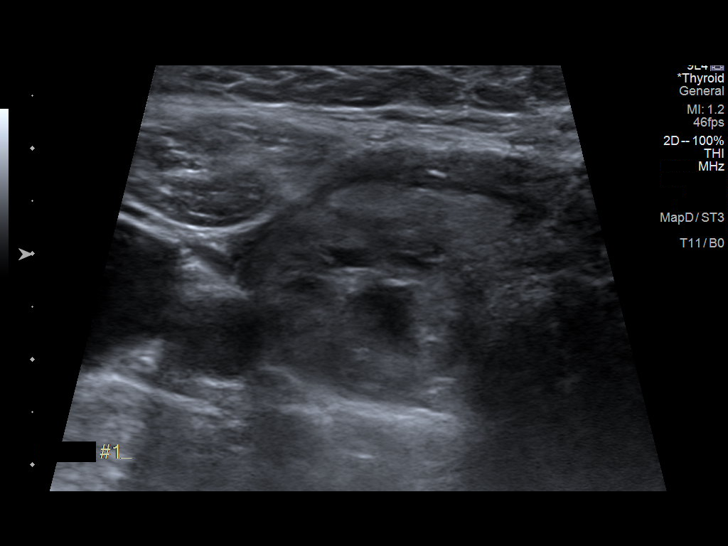
[im 7/13]
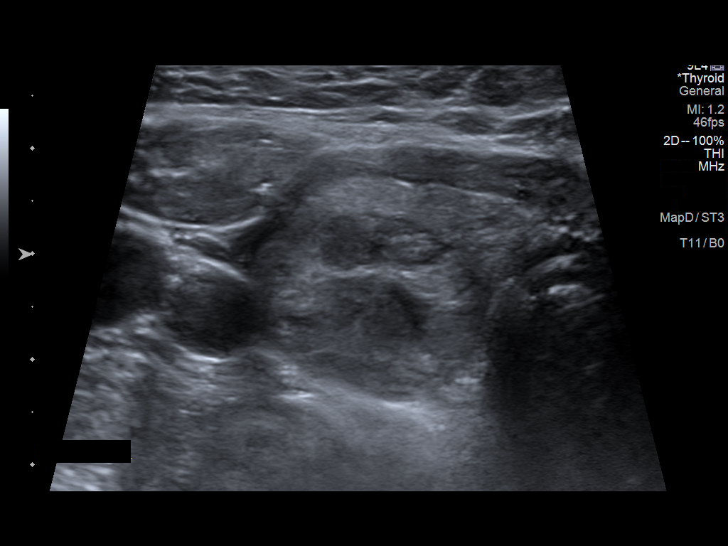
[im 8/13]
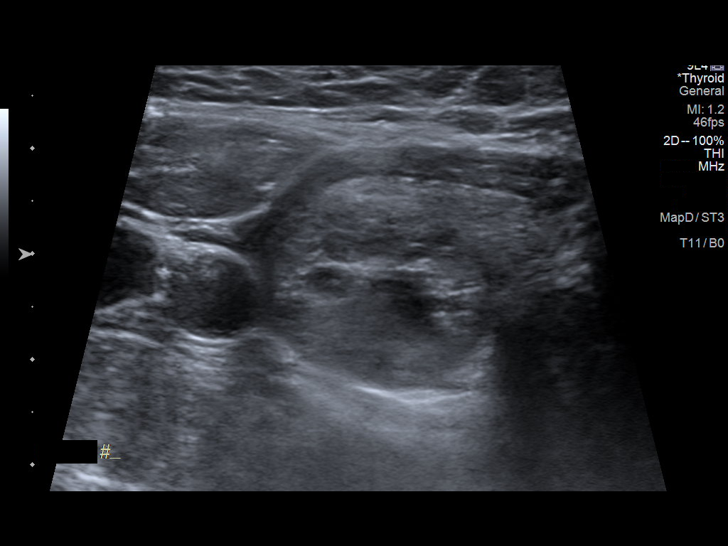
[im 9/13]
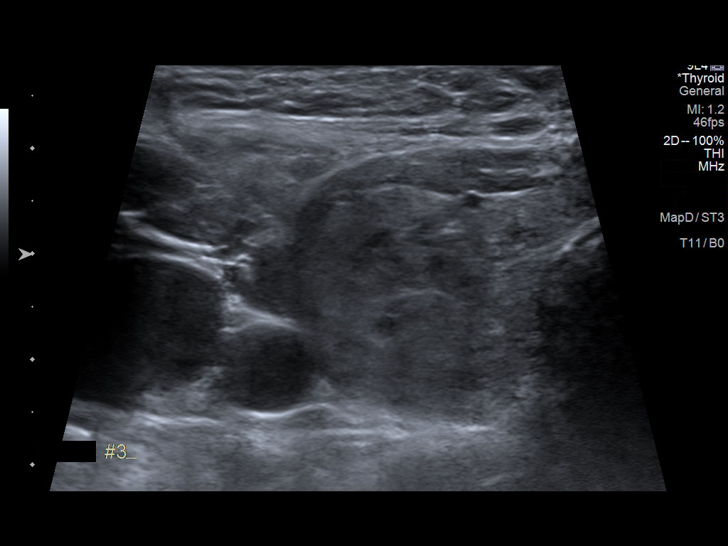
[im 10/13]
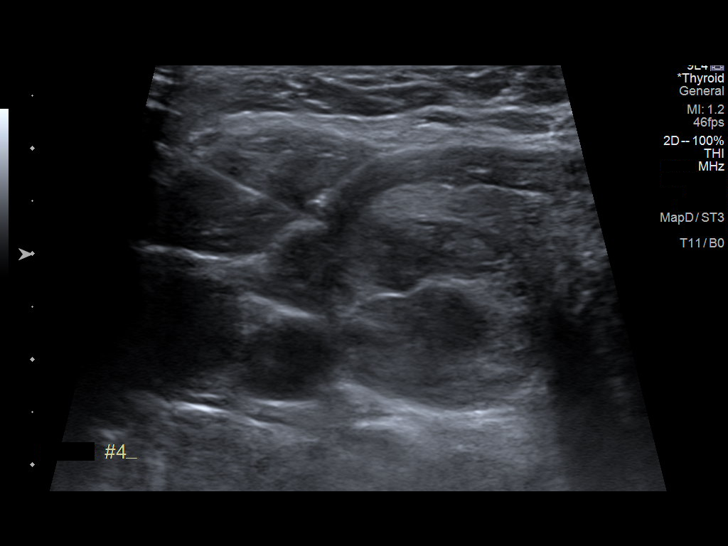
[im 11/13]
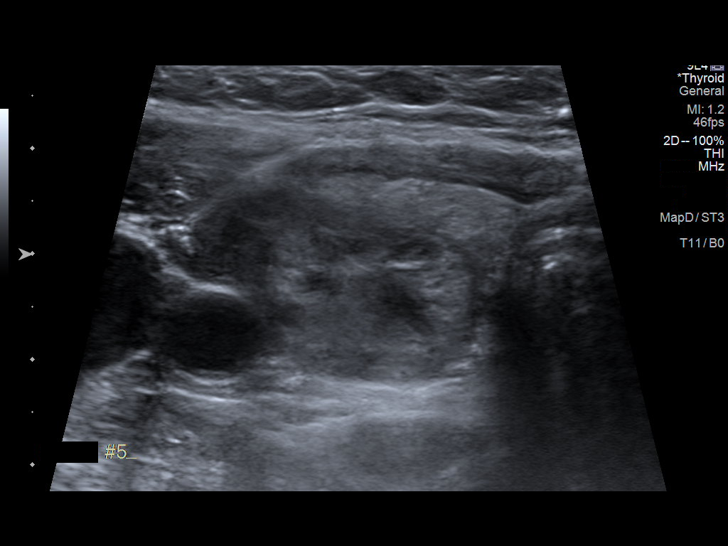
[im 12/13]
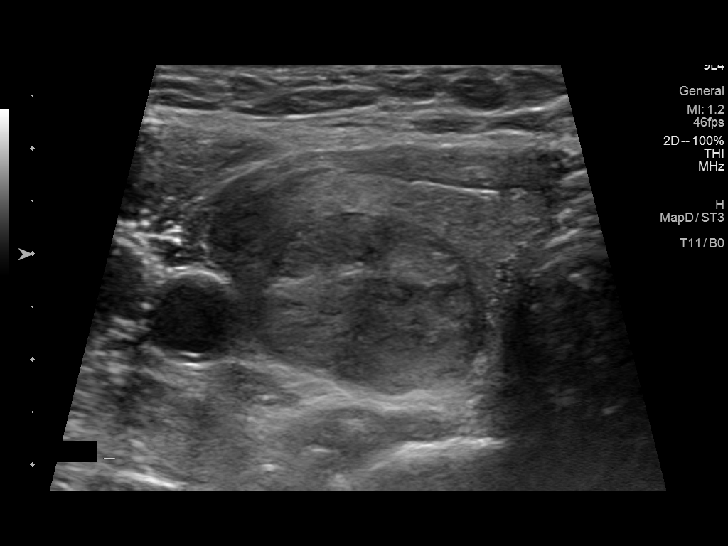
[im 13/13]
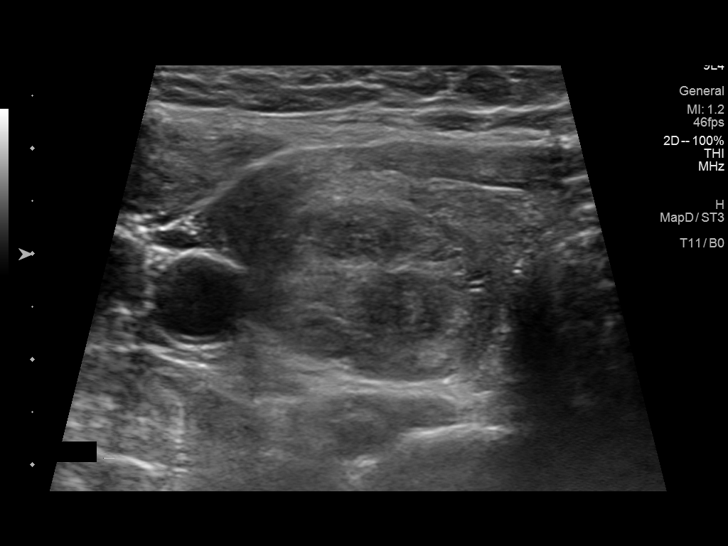

[13 of 13 positions shown; findings below may reference images not displayed]

Pre-procedural ultrasound scanning demonstrated the indeterminate
nodule within the right lobe of the thyroid

The procedure was planned. The neck was prepped in the usual sterile
fashion, and a sterile drape was applied covering the operative
field. A timeout was performed prior to the initiation of the
procedure. Local anesthesia was provided with 1% lidocaine.

Under direct ultrasound guidance, 5 FNA biopsies were performed of
the nodule with a 27 gauge needle. Multiple ultrasound images were
saved for procedural documentation purposes. The samples were
prepared and submitted to pathology. Two of the specimens were
reserved for Afirma testing.

Limited post procedural scanning was negative for hematoma or
additional complication. Dressings were placed. The patient
tolerated the above procedures procedure well without immediate
postprocedural complication.
FINDINGS: Maximum size: 2.7 cm

Location: Right; Mid

Reason for biopsy: Increased in size since prior

Ultrasound imaging confirms appropriate placement of the needles
within the thyroid nodule.
IMPRESSION: Technically successful ultrasound guided fine needle aspiration of
right mid lobe thyroid nodule

Performed and read by Luang, Mak[place]

## 2022-09-14 ENCOUNTER — Other Ambulatory Visit (HOSPITAL_BASED_OUTPATIENT_CLINIC_OR_DEPARTMENT_OTHER): Payer: Self-pay

## 2022-09-14 MED ORDER — AMOXICILLIN 875 MG PO TABS
875.0000 mg | ORAL_TABLET | Freq: Two times a day (BID) | ORAL | 0 refills | Status: AC
  Filled 2022-09-14: qty 20, 10d supply, fill #0
